# Patient Record
Sex: Male | Born: 1950 | Race: Black or African American | Hispanic: No | Marital: Single | Smoking: Never smoker
Health system: Southern US, Community
[De-identification: ages and names within clinical notes are randomized; demographics above are authoritative.]

## PROBLEM LIST (undated history)

## (undated) DIAGNOSIS — K4090 Unilateral inguinal hernia, without obstruction or gangrene, not specified as recurrent: Secondary | ICD-10-CM

## (undated) DIAGNOSIS — T421X1A Poisoning by iminostilbenes, accidental (unintentional), initial encounter: Secondary | ICD-10-CM

## (undated) DIAGNOSIS — R569 Unspecified convulsions: Secondary | ICD-10-CM

## (undated) DIAGNOSIS — I10 Essential (primary) hypertension: Secondary | ICD-10-CM

## (undated) DIAGNOSIS — J181 Lobar pneumonia, unspecified organism: Secondary | ICD-10-CM

## (undated) DIAGNOSIS — E871 Hypo-osmolality and hyponatremia: Secondary | ICD-10-CM

## (undated) DIAGNOSIS — Z9189 Other specified personal risk factors, not elsewhere classified: Secondary | ICD-10-CM

## (undated) DIAGNOSIS — K219 Gastro-esophageal reflux disease without esophagitis: Secondary | ICD-10-CM

## (undated) DIAGNOSIS — F79 Unspecified intellectual disabilities: Secondary | ICD-10-CM

## (undated) HISTORY — PX: APPENDECTOMY: SHX54

## (undated) HISTORY — PX: TONSILLECTOMY: SUR1361

## (undated) HISTORY — PX: MULTIPLE EXTRACTIONS WITH ALVEOLOPLASTY: SHX5342

---

## 1999-09-02 ENCOUNTER — Encounter: Admission: RE | Admit: 1999-09-02 | Discharge: 1999-09-02 | Payer: Self-pay | Admitting: Internal Medicine

## 2003-02-20 ENCOUNTER — Encounter: Admission: RE | Admit: 2003-02-20 | Discharge: 2003-02-20 | Payer: Self-pay | Admitting: Dentistry

## 2003-03-28 ENCOUNTER — Encounter: Payer: Self-pay | Admitting: Internal Medicine

## 2003-03-28 ENCOUNTER — Encounter: Admission: RE | Admit: 2003-03-28 | Discharge: 2003-03-28 | Payer: Self-pay | Admitting: Internal Medicine

## 2003-03-28 ENCOUNTER — Ambulatory Visit (HOSPITAL_COMMUNITY): Admission: RE | Admit: 2003-03-28 | Discharge: 2003-03-28 | Payer: Self-pay | Admitting: Internal Medicine

## 2003-04-04 ENCOUNTER — Ambulatory Visit (HOSPITAL_COMMUNITY): Admission: RE | Admit: 2003-04-04 | Discharge: 2003-04-04 | Payer: Self-pay | Admitting: Dentistry

## 2004-07-30 ENCOUNTER — Ambulatory Visit: Payer: Self-pay | Admitting: Dentistry

## 2007-12-12 ENCOUNTER — Emergency Department (HOSPITAL_COMMUNITY): Admission: EM | Admit: 2007-12-12 | Discharge: 2007-12-12 | Payer: Self-pay | Admitting: Emergency Medicine

## 2008-05-23 ENCOUNTER — Emergency Department (HOSPITAL_COMMUNITY): Admission: EM | Admit: 2008-05-23 | Discharge: 2008-05-23 | Payer: Self-pay | Admitting: Emergency Medicine

## 2008-07-19 ENCOUNTER — Emergency Department (HOSPITAL_COMMUNITY): Admission: EM | Admit: 2008-07-19 | Discharge: 2008-07-19 | Payer: Self-pay | Admitting: Emergency Medicine

## 2008-07-30 ENCOUNTER — Emergency Department (HOSPITAL_COMMUNITY): Admission: EM | Admit: 2008-07-30 | Discharge: 2008-07-30 | Payer: Self-pay | Admitting: Emergency Medicine

## 2010-12-24 ENCOUNTER — Emergency Department (HOSPITAL_COMMUNITY)
Admission: EM | Admit: 2010-12-24 | Discharge: 2010-12-24 | Disposition: A | Discharge status: Home / Self Care | Payer: PRIVATE HEALTH INSURANCE | Attending: Emergency Medicine | Admitting: Emergency Medicine

## 2010-12-24 DIAGNOSIS — Z79899 Other long term (current) drug therapy: Secondary | ICD-10-CM | POA: Insufficient documentation

## 2010-12-24 DIAGNOSIS — F79 Unspecified intellectual disabilities: Secondary | ICD-10-CM | POA: Insufficient documentation

## 2010-12-24 DIAGNOSIS — G40909 Epilepsy, unspecified, not intractable, without status epilepticus: Secondary | ICD-10-CM | POA: Insufficient documentation

## 2010-12-24 LAB — POCT I-STAT, CHEM 8
BUN: 10 mg/dL (ref 6–23)
Calcium, Ion: 1 mmol/L — ABNORMAL LOW (ref 1.12–1.32)
Chloride: 100 mEq/L (ref 96–112)
Glucose, Bld: 124 mg/dL — ABNORMAL HIGH (ref 70–99)
Potassium: 4.1 mEq/L (ref 3.5–5.1)

## 2010-12-26 ENCOUNTER — Emergency Department (HOSPITAL_COMMUNITY)
Admission: EM | Admit: 2010-12-26 | Discharge: 2010-12-27 | Disposition: A | Discharge status: Home / Self Care | Payer: PRIVATE HEALTH INSURANCE | Attending: Emergency Medicine | Admitting: Emergency Medicine

## 2010-12-26 DIAGNOSIS — J020 Streptococcal pharyngitis: Secondary | ICD-10-CM | POA: Insufficient documentation

## 2010-12-26 DIAGNOSIS — F79 Unspecified intellectual disabilities: Secondary | ICD-10-CM | POA: Insufficient documentation

## 2010-12-26 DIAGNOSIS — G40909 Epilepsy, unspecified, not intractable, without status epilepticus: Secondary | ICD-10-CM | POA: Insufficient documentation

## 2010-12-26 DIAGNOSIS — H9210 Otorrhea, unspecified ear: Secondary | ICD-10-CM | POA: Insufficient documentation

## 2010-12-26 DIAGNOSIS — R509 Fever, unspecified: Secondary | ICD-10-CM | POA: Insufficient documentation

## 2010-12-26 DIAGNOSIS — H60399 Other infective otitis externa, unspecified ear: Secondary | ICD-10-CM | POA: Insufficient documentation

## 2011-01-16 NOTE — Op Note (Signed)
Craig, Yates                          ACCOUNT NO.:  [id number]   MEDICAL RECORD NO.:  [id number]                   PATIENT TYPE:  AMB   LOCATION:  DAY                                  FACILITY:  [facility]   PHYSICIAN:  Charlynne Pander, D.D.S.          DATE OF BIRTH:  1950/12/26   DATE OF PROCEDURE:  04/04/2003  DATE OF DISCHARGE:                                 OPERATIVE REPORT   PREOPERATIVE DIAGNOSES:  1. Chronic seizure disorder.  2. Mental retardation.  3. History of acute pulpitis.  4. Chronic apical periodontitis.  5. Chronic periodontitis.  6. Multiple retained root segments.  7. Need for upper right quadrant tooth prosthetic surgery.   POSTOPERATIVE DIAGNOSES:  1. Chronic seizure disorder.  2. Mental retardation.  3. History of acute pulpitis.  4. Chronic apical periodontitis.  5. Chronic periodontitis.  6. Multiple retained root segments.  7. Need for upper right quadrant tooth prosthetic surgery.   OPERATIONS:  1. Dental examination.  2. Extraction of remaining teeth (tooth #s     2,3,5,6,7,17,20,21,22,23,24,25,26,27,28 and 29).  3. Three quadrants of alveoloplasty.  4. Upper right quadrant osseous tuberosity reduction.   SURGEON:  Charlynne Pander, D.D.S.   ASSISTANT:  Elliot Dally (Sales executive).   ANESTHESIA:  1. General anesthesia with a nasoendotracheal tube.  2. Local anesthesia with a total utilization of 5 carpules each, containing     36 mg of Xylocaine with  0.018 mg of epinephrine; as well as 2 carpules each containing 9 mg Marcaine  with 0.009 mg of epinephrine.   MEDICATIONS:  Ampicillin 2.0 g IV prior to invasive dental procedures.   SPECIMENS:  There were 16 teeth which were discarded.   DRAINS:  None.   CULTURES:  None.   COMPLICATIONS:  None.   FLUIDS:  1000 mL lactated Ringer's solution.   ESTIMATED BLOOD LOSS:  100 mL.   INDICATIONS:  The patient had a history of chronic seizure disorder and  mental retardation,  which prevented dental care from occurring in the  outpatient setting.  The patient was examined and treatment planned for  extraction of his remaining teeth, along with alveoloplasty and  preprosthetic surgery as indicated.  This treatment plan was formulated to  decrease the risk of complications associated with dental infection from  affecting the patient's systemic health and seizure disorder.   OPERATIVE FINDINGS:  The patient was examined in the operating room #6 at  [facility].  The teeth were identified for extraction.  The  patient was noted to be affected by a history of acute pulpitis symptoms,  chronic apical periodontitis, chronic periodontitis, multiple retained root  segments, and a need for preprosthetic surgery of the upper right quadrant.   DESCRIPTION OF PROCEDURE:  The patient was brought to the main operating  room #6.  The patient was placed in the supine position on the operating  room table.  General  anesthesia was induced with the nasoendotracheal tube,  per the anesthesia team.  The patient was then prepped and draped in the  usual manner for dental medicine procedure.  The oral cavity was thoroughly  examined, with the findings as noted above.  The patient was then ready for  the oral surgical procedure as follows:   Local anesthesia was administered sequentially throughout the 1-1/2 hour  long procedure.   The maxillary right quadrant was first approached.  Anesthesia was delivered  appropriately.  A #15 blade incision was made from the distal of the  tuberosity through the meso of #8.  A surgical flap was then deflected.  The  remaining teeth were then elevated with a series of straight elevators.  Tooth #2 was then removed with the 53R forceps without complications.  The  root segments for #'s 3, 5, 6 and 7 were then elevated out, and removed with  a 150 forceps without complications.  Alveoloplasty was then performed,  utilizing rongeurs and  bone file.  An osseous maxillary tuberosity reduction  was then achieved again with a rongeurs and bone file.  The tissues were  trimmed appropriately utilizing a #15 blade and soft tissue pickups.  The  surgical site was then irrigated with copious amounts of sterile saline.  The tissues were then trimmed appropriately with a soft tissue scissors.  The surgical site was then again irrigated with copious amounts of sterile  saline.  The surgical site was then closed from the distal __________  tuberosity through the meso of #8, utilizing 3-0 chromic gut suture in a  continuous interrupted suture technique x1.   The mandibular quadrants were then approached.  Anesthesia was delivered to  the mandibular left and mandibular right quadrants.  A #15 blade incision  was made from the distal of #30 through the meso of #22.  A surgical flap  was then reflected.  Tooth #s 23, 24, 25, 26, 27, 28 and 29 were then  elevated with a series of straight elevators.  These teeth were then removed  with a 151 forceps without complications.  Alveoloplasty was then performed,  utilizing rongeurs and bone file.  The tissues were then trimmed  appropriately.  The entire quadrant was then irrigated with copious amounts  of sterile saline.  The surgical site was then closed from the distal of #30  through the meso of #25, utilizing 3-0 chromic gut suture material in a  continuous interrupted suture technique x1.  An additional interrupted  suture was placed in the area of tooth #s 24 and 25.   The mandibular left quadrant was then approached.  A #15 blade incision was  made from the distal of #17 through the meso of #22.  A surgical flap was  then reflected.  The remaining lower left teeth were then subluxated with a  series of straight elevators.  Tooth #s 22, 21 and 20 were then removed with a 151 forceps without complications.  Tooth #17 was then approached.  The  tooth was sectioned utilizing a surgical  handpiece and bur, and copious  amounts of sterile saline.  The remaining root segments were then elevated  and removed with a 151 forceps without complications.  Alveoloplasty was  then performed, utilizing rongeurs and bone file.  The surgical site was  then irrigated with copious amounts of sterile saline.  The tissues were  then approximated and trimmed appropriately.  The surgical site was then  closed with a 3-0 chromic gut  suture material, and a continuous interrupted  suture technique from the distal of #17 through the meso of #24.   The entire mouth was then irrigated with copious amounts of sterile saline.  The patient was examined for complications, seeing none, the dental medicine  procedure was deemed to be complete.  The throat pack, which was placed at  the initiation of dental medicine procedure, was then removed at this time.  A series of 4 x 4 gauzes were placed in the mouth to aid hemostasis.  An  oral airway was also placed at this time at the request of the anesthesia  team.  The patient was then handed over to the anesthesia team for final  disposition.  After an appropriate amount of time, the patient was extubated  appropriately.  The patient was then transported to the post-anesthesia care  unit with stable vital signs and a good oxygenation level.  All counts were  correct for the dental medicine procedure.                                               Charlynne Pander, D.D.S.    RFK/MEDQ  D:  04/04/2003  T:  04/04/2003  Job:  [id number]   cc:   Marlan Palau, M.D.  [address]  [address]  [place]  [place] [address]  Fax: [phone]   Gertha Calkin, M.D.  Int. Med - Resident - [address]  [place], [place] [address]  Fax: [phone]   [phone], Dr. Chrisandra Carota  COPY DENTAL MEDICINE,

## 2011-06-01 LAB — COMPREHENSIVE METABOLIC PANEL
AST: 28
Albumin: 4.2
Alkaline Phosphatase: 132 — ABNORMAL HIGH
BUN: 11
CO2: 26
Chloride: 102
Creatinine, Ser: 0.9
GFR calc Af Amer: >60
GFR calc non Af Amer: >60
Potassium: 4.6
Total Bilirubin: 0.9

## 2011-06-01 LAB — CBC
MCHC: 33.5
Platelets: 208
RBC: 4.73

## 2011-06-01 LAB — DIFFERENTIAL
Basophils Absolute: 0
Basophils Relative: 0
Eosinophils Relative: 0
Lymphocytes Relative: 24
Monocytes Absolute: 0.6

## 2011-06-01 LAB — CARBAMAZEPINE LEVEL, TOTAL: Carbamazepine Lvl: 7.1

## 2011-06-02 LAB — POCT I-STAT, CHEM 8
BUN: 10
Calcium, Ion: 1.14
Chloride: 100
Creatinine, Ser: 1.1
Glucose, Bld: 115 — ABNORMAL HIGH
HCT: 43
Hemoglobin: 14.6
Potassium: 4.2
Sodium: 139
TCO2: 30

## 2011-06-02 LAB — GLUCOSE, CAPILLARY: Glucose-Capillary: 130 — ABNORMAL HIGH

## 2011-06-02 LAB — CARBAMAZEPINE LEVEL, TOTAL: Carbamazepine Lvl: 13.3 — ABNORMAL HIGH

## 2012-05-28 ENCOUNTER — Emergency Department (HOSPITAL_COMMUNITY)
Admission: EM | Admit: 2012-05-28 | Discharge: 2012-05-28 | Disposition: A | Discharge status: Home / Self Care | Payer: No Typology Code available for payment source | Attending: Emergency Medicine | Admitting: Emergency Medicine

## 2012-05-28 ENCOUNTER — Encounter (HOSPITAL_COMMUNITY): Payer: Self-pay | Admitting: Emergency Medicine

## 2012-05-28 DIAGNOSIS — S0093XA Contusion of unspecified part of head, initial encounter: Secondary | ICD-10-CM

## 2012-05-28 DIAGNOSIS — Y92410 Unspecified street and highway as the place of occurrence of the external cause: Secondary | ICD-10-CM | POA: Insufficient documentation

## 2012-05-28 DIAGNOSIS — I10 Essential (primary) hypertension: Secondary | ICD-10-CM | POA: Insufficient documentation

## 2012-05-28 DIAGNOSIS — S0083XA Contusion of other part of head, initial encounter: Secondary | ICD-10-CM | POA: Insufficient documentation

## 2012-05-28 DIAGNOSIS — S0003XA Contusion of scalp, initial encounter: Secondary | ICD-10-CM | POA: Insufficient documentation

## 2012-05-28 HISTORY — DX: Unspecified intellectual disabilities: F79

## 2012-05-28 HISTORY — DX: Unspecified convulsions: R56.9

## 2012-05-28 HISTORY — DX: Essential (primary) hypertension: I10
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
## 2013-03-31 HISTORY — PX: MULTIPLE EXTRACTIONS WITH ALVEOLOPLASTY: SHX5342

## 2013-06-18 ENCOUNTER — Encounter (HOSPITAL_COMMUNITY): Payer: Self-pay | Admitting: Emergency Medicine

## 2013-06-18 ENCOUNTER — Emergency Department (HOSPITAL_COMMUNITY)
Admission: EM | Admit: 2013-06-18 | Discharge: 2013-06-18 | Disposition: A | Discharge status: Home / Self Care | Payer: Medicaid Other | Attending: Emergency Medicine | Admitting: Emergency Medicine

## 2013-06-18 DIAGNOSIS — R569 Unspecified convulsions: Secondary | ICD-10-CM | POA: Insufficient documentation

## 2013-06-18 DIAGNOSIS — F79 Unspecified intellectual disabilities: Secondary | ICD-10-CM | POA: Insufficient documentation

## 2013-06-18 DIAGNOSIS — Z79899 Other long term (current) drug therapy: Secondary | ICD-10-CM | POA: Insufficient documentation

## 2013-06-18 DIAGNOSIS — Z0000 Encounter for general adult medical examination without abnormal findings: Secondary | ICD-10-CM

## 2013-06-18 DIAGNOSIS — Z0389 Encounter for observation for other suspected diseases and conditions ruled out: Secondary | ICD-10-CM | POA: Insufficient documentation

## 2013-06-18 DIAGNOSIS — I10 Essential (primary) hypertension: Secondary | ICD-10-CM | POA: Insufficient documentation

## 2013-06-18 NOTE — ED Provider Notes (Signed)
CSN: [id number]     Arrival date & time 06/18/13  1191 History  This chart was scribed for non-physician practitioner working with Harrold Donath R. Rubin Payor, MD by Greggory Stallion, ED scribe. This patient was seen in room TR07C/TR07C and the patient's care was started at 10:06 AM.   Chief Complaint  Patient presents with  . Follow-up   The history is provided by the patient and a relative. No language interpreter was used.   HPI Comments: Craig Yates is a 62 y.o. male with h/o MR who presents to the Emergency Department complaining of resolving sore throat. He has been handling secretions normally. Pt denies smoking cigarettes and fever. Sister wanted him to be evaluated since she has had respiratory symptoms.   Past Medical History  Diagnosis Date  . Hypertension   . Mental retardation   . Seizures    No past surgical history on file. No family history on file. History  Substance Use Topics  . Smoking status: Never Smoker   . Smokeless tobacco: Not on file  . Alcohol Use: No    Review of Systems  Unable to perform ROS   Allergies  Review of patient's allergies indicates no known allergies.  Home Medications   Current Outpatient Rx  Name  Route  Sig  Dispense  Refill  . acetaminophen (TYLENOL) 500 MG tablet   Oral   Take 1,000 mg by mouth every 6 (six) hours as needed for pain.         . carbamazepine (TEGRETOL) 200 MG tablet   Oral   Take 400 mg by mouth 3 (three) times daily.         Marland Kitchen gabapentin (NEURONTIN) 300 MG capsule   Oral   Take 300 mg by mouth 3 (three) times daily.         . simvastatin (ZOCOR) 20 MG tablet   Oral   Take 20 mg by mouth every evening.          BP 161/97  Pulse 60  Temp(Src) 97.9 F (36.6 C) (Oral)  Resp 20  SpO2 100%  Physical Exam  Nursing note and vitals reviewed. Constitutional: He is oriented to person, place, and time. He appears well-developed and well-nourished. No distress.  HENT:  Head: Normocephalic and  atraumatic.  Mouth/Throat: Oropharynx is clear and moist.  Eyes: EOM are normal.  Neck: Neck supple. No tracheal deviation present.  Cardiovascular: Normal rate, regular rhythm and normal heart sounds.   Pulmonary/Chest: Effort normal and breath sounds normal. No respiratory distress. He has no wheezes. He has no rales.  Musculoskeletal: Normal range of motion.  Neurological: He is alert and oriented to person, place, and time.  Skin: Skin is warm and dry.  Psychiatric: He has a normal mood and affect. His behavior is normal.    ED Course  Procedures (including critical care time)  DIAGNOSTIC STUDIES: Oxygen Saturation is 100% on RA, normal by my interpretation.    COORDINATION OF CARE: 10:09 AM-Discussed treatment plan which includes discharge with pt at bedside and pt agreed to plan.   Labs Review Labs Reviewed - No data to display Imaging Review No results found.  EKG Interpretation   None       MDM   1. Normal physical exam    Pt here with sister. He has history of mental retardation and sister provides information for HPI. Pt without signs of respiratory infection. He is well in appearance. No fever. Will discharge home without treatment.  I personally performed the services described in this documentation, which was scribed in my presence. The recorded information has been reviewed and is accurate.    Arthor Captain, PA-C 06/18/13 1431

## 2013-06-18 NOTE — ED Provider Notes (Signed)
Medical screening examination/treatment/procedure(s) were performed by non-physician practitioner and as supervising physician I was immediately available for consultation/collaboration.  Cammi Consalvo R. Arlie Riker, MD 06/18/13 1621 

## 2013-06-18 NOTE — ED Notes (Signed)
Pt discharged home with all belongings, alert and ambulatory upon discharge, no new RX pt and pt's sister verbalize understanding of discharge instructions

## 2013-06-18 NOTE — ED Notes (Signed)
Pt denies any pain or symptoms. Pt's sister brought him to ED because she felt like he was swallowing funny and he was also getting a sore throat. Sister is requesting we look at his throat and to check him out to make sure he doesn't have strep throat.

## 2013-09-11 ENCOUNTER — Ambulatory Visit: Payer: Self-pay | Admitting: Podiatry

## 2013-09-25 ENCOUNTER — Encounter: Payer: Self-pay | Admitting: Podiatry

## 2013-09-25 ENCOUNTER — Ambulatory Visit (INDEPENDENT_AMBULATORY_CARE_PROVIDER_SITE_OTHER): Payer: Medicare Other | Admitting: Podiatry

## 2013-09-25 ENCOUNTER — Ambulatory Visit: Payer: Self-pay | Admitting: Podiatry

## 2013-09-25 VITALS — BP 132/80 | HR 85 | Resp 12

## 2013-09-25 DIAGNOSIS — B351 Tinea unguium: Secondary | ICD-10-CM

## 2013-09-25 DIAGNOSIS — M79609 Pain in unspecified limb: Secondary | ICD-10-CM

## 2013-09-26 NOTE — Progress Notes (Signed)
Patient ID: Craig Yates, male   DOB: 04/22/1951, 63 y.o.   MRN: [id number]  Subjective: This patient presents for ongoing debridement of painful mycotic toenails.The last visit for this service was 05/25/2013 provided by DR. Regal.  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 Reappoint at three-month intervals

## 2013-11-22 ENCOUNTER — Emergency Department (HOSPITAL_COMMUNITY)
Admission: EM | Admit: 2013-11-22 | Discharge: 2013-11-23 | Disposition: A | Discharge status: Home / Self Care | Payer: Medicare Other | Attending: Emergency Medicine | Admitting: Emergency Medicine

## 2013-11-22 ENCOUNTER — Encounter (HOSPITAL_COMMUNITY): Payer: Self-pay | Admitting: Emergency Medicine

## 2013-11-22 DIAGNOSIS — J069 Acute upper respiratory infection, unspecified: Secondary | ICD-10-CM

## 2013-11-22 DIAGNOSIS — I10 Essential (primary) hypertension: Secondary | ICD-10-CM | POA: Insufficient documentation

## 2013-11-22 DIAGNOSIS — R079 Chest pain, unspecified: Secondary | ICD-10-CM | POA: Insufficient documentation

## 2013-11-22 DIAGNOSIS — F79 Unspecified intellectual disabilities: Secondary | ICD-10-CM | POA: Insufficient documentation

## 2013-11-22 DIAGNOSIS — Z79899 Other long term (current) drug therapy: Secondary | ICD-10-CM | POA: Insufficient documentation

## 2013-11-22 DIAGNOSIS — G40909 Epilepsy, unspecified, not intractable, without status epilepticus: Secondary | ICD-10-CM | POA: Insufficient documentation

## 2013-11-22 NOTE — ED Notes (Signed)
Sister reports non-productive cough x 3-4 days.  States she believes he may have a sore throat or ear pain.

## 2013-11-23 NOTE — ED Provider Notes (Signed)
CSN: [id number]     Arrival date & time 11/22/13  2317 History   First MD Initiated Contact with Patient 11/22/13 2343     Chief Complaint  Patient presents with  . Cough     (Consider location/radiation/quality/duration/timing/severity/associated sxs/prior Treatment) Patient is a 63 y.o. male presenting with cough. The history is provided by the patient and a parent. No language interpreter was used.  Cough Cough characteristics:  Dry Associated symptoms: chest pain, rhinorrhea and sore throat   Associated symptoms: no chills, no fever and no shortness of breath   Associated symptoms comment:  Per the patient's mother, he has had a cough for the past 3 days. No fever, nausea or vomiting. He has had some nasal congestion. He is handicapped, MR, and she is concerned that he isn't able to verbalize when he is ill so wanted to have him evaluated.    Past Medical History  Diagnosis Date  . Hypertension   . Mental retardation   . Seizures    History reviewed. No pertinent past surgical history. No family history on file. History  Substance Use Topics  . Smoking status: Never Smoker   . Smokeless tobacco: Not on file  . Alcohol Use: No    Review of Systems  Constitutional: Negative for fever and chills.  HENT: Positive for rhinorrhea and sore throat.   Respiratory: Positive for cough. Negative for shortness of breath.   Cardiovascular: Positive for chest pain.       Chest pain with cough.  Gastrointestinal: Negative.  Negative for nausea and vomiting.  Musculoskeletal: Negative.   Skin: Negative.   Neurological: Negative.       Allergies  Review of patient's allergies indicates no known allergies.  Home Medications   Current Outpatient Rx  Name  Route  Sig  Dispense  Refill  . carbamazepine (TEGRETOL) 200 MG tablet   Oral   Take 400 mg by mouth 3 (three) times daily.         Marland Kitchen. gabapentin (NEURONTIN) 300 MG capsule   Oral   Take 300 mg by mouth 3 (three) times  daily.         . simvastatin (ZOCOR) 20 MG tablet   Oral   Take 20 mg by mouth every evening.         . Vitamin D, Ergocalciferol, (DRISDOL) 50000 UNITS CAPS capsule   Oral   Take 50,000 Units by mouth every 7 (seven) days. Every Friday          BP 178/95  Pulse 70  Temp(Src) 98.7 F (37.1 C) (Oral)  Resp 18  Wt 173 lb 2 oz (78.529 kg)  SpO2 100% Physical Exam  Constitutional: He appears well-developed and well-nourished.  HENT:  Head: Normocephalic.  Right Ear: External ear normal.  Left Ear: External ear normal.  Nose: Nose normal.  Mouth/Throat: Oropharynx is clear and moist. No oropharyngeal exudate.  Eyes: Conjunctivae are normal.  Neck: Normal range of motion. Neck supple.  Cardiovascular: Normal rate and regular rhythm.   Pulmonary/Chest: Effort normal and breath sounds normal. He has no wheezes. He has no rales.  Abdominal: Soft. Bowel sounds are normal. There is no tenderness. There is no rebound and no guarding.  Musculoskeletal: Normal range of motion.  Neurological: He is alert.  Skin: Skin is warm and dry. No rash noted.  Psychiatric: He has a normal mood and affect.    ED Course  Procedures (including critical care time) Labs Review Labs Reviewed - No  data to display Imaging Review No results found.   EKG Interpretation None      MDM   Final diagnoses:  None    1. URI  Supportive care recommended for patient with viral appearing presentation.     Arnoldo Hooker, PA-C 11/23/13 0149

## 2013-11-23 NOTE — Discharge Instructions (Signed)
RECOMMEND MUCINEX, TYLENOL, PLENTY OF FLUIDS. FOLLOW UP WITH YOUR DOCTOR IF SYMPTOMS WORSEN. RETURN HERE AS NEEDED.  Upper Respiratory Infection, Adult An upper respiratory infection (URI) is also known as the common cold. It is often caused by a type of germ (virus). Colds are easily spread (contagious). You can pass it to others by kissing, coughing, sneezing, or drinking out of the same glass. Usually, you get better in 1 or 2 weeks.  HOME CARE   Only take medicine as told by your doctor.  Use a warm mist humidifier or breathe in steam from a hot shower.  Drink enough water and fluids to keep your pee (urine) clear or pale yellow.  Get plenty of rest.  Return to work when your temperature is back to normal or as told by your doctor. You may use a face mask and wash your hands to stop your cold from spreading. GET HELP RIGHT AWAY IF:   After the first few days, you feel you are getting worse.  You have questions about your medicine.  You have chills, shortness of breath, or brown or red spit (mucus).  You have yellow or brown snot (nasal discharge) or pain in the face, especially when you bend forward.  You have a fever, puffy (swollen) neck, pain when you swallow, or white spots in the back of your throat.  You have a bad headache, ear pain, sinus pain, or chest pain.  You have a high-pitched whistling sound when you breathe in and out (wheezing).  You have a lasting cough or cough up blood.  You have sore muscles or a stiff neck. MAKE SURE YOU:   Understand these instructions.  Will watch your condition.  Will get help right away if you are not doing well or get worse. Document Released: 02/03/2008 Document Revised: 11/09/2011 Document Reviewed: 12/22/2010 [facility] Patient Information 2014 [employer], [place].

## 2013-11-26 NOTE — ED Provider Notes (Signed)
Medical screening examination/treatment/procedure(s) were performed by non-physician practitioner and as supervising physician I was immediately available for consultation/collaboration.   EKG Interpretation None        Brandt LoosenJulie Manly, MD 11/26/13 [phone]

## 2013-12-25 ENCOUNTER — Encounter: Payer: Self-pay | Admitting: Podiatry

## 2013-12-25 ENCOUNTER — Ambulatory Visit (INDEPENDENT_AMBULATORY_CARE_PROVIDER_SITE_OTHER): Payer: Medicare Other | Admitting: Podiatry

## 2013-12-25 VITALS — BP 164/92 | HR 67 | Resp 18

## 2013-12-25 DIAGNOSIS — M79609 Pain in unspecified limb: Secondary | ICD-10-CM

## 2013-12-25 DIAGNOSIS — B351 Tinea unguium: Secondary | ICD-10-CM

## 2013-12-26 NOTE — Progress Notes (Signed)
Patient ID: Craig Yates, male   DOB: 12/21/1950, 63 y.o.   MRN: [id number] Subjective: This patient presents for ongoing debridement of painful mycotic toenails.  Objective: Incurvated, elongated, discolored, hypertrophic toenails x10  Assessment: Symptomatic onychomycoses x10  Plan: Nails x10 are debrided without a bleeding. Reappoint at three-month intervals.

## 2014-03-26 ENCOUNTER — Ambulatory Visit: Payer: Medicare Other | Admitting: Podiatry

## 2014-04-16 ENCOUNTER — Ambulatory Visit (INDEPENDENT_AMBULATORY_CARE_PROVIDER_SITE_OTHER): Payer: Medicare Other | Admitting: Podiatry

## 2014-04-16 ENCOUNTER — Encounter: Payer: Self-pay | Admitting: Podiatry

## 2014-04-16 VITALS — BP 157/90 | HR 64 | Resp 18

## 2014-04-16 DIAGNOSIS — M79609 Pain in unspecified limb: Secondary | ICD-10-CM

## 2014-04-16 DIAGNOSIS — M79676 Pain in unspecified toe(s): Secondary | ICD-10-CM

## 2014-04-16 DIAGNOSIS — B351 Tinea unguium: Secondary | ICD-10-CM

## 2014-04-17 NOTE — Progress Notes (Signed)
Patient ID: Craig Yates, male   DOB: [birth year], 63 y.o.   MRN: [id number]  Subjective: This patient presents for ongoing debrided of painful toenails  Objective: Elongated, hypertrophic, discolored toenails 6-10  Assessment: Symptomatic onychomycoses x10  Plan: Nails x10 are debrided without any bleeding  Reappoint x3 months

## 2014-05-15 ENCOUNTER — Observation Stay (HOSPITAL_COMMUNITY)
Admission: EM | Admit: 2014-05-15 | Discharge: 2014-05-16 | Disposition: A | Discharge status: Home / Self Care | Payer: Medicare Other | Attending: Internal Medicine | Admitting: Internal Medicine

## 2014-05-15 ENCOUNTER — Observation Stay (HOSPITAL_COMMUNITY): Payer: Medicare Other

## 2014-05-15 ENCOUNTER — Encounter (HOSPITAL_COMMUNITY): Payer: Self-pay | Admitting: Emergency Medicine

## 2014-05-15 DIAGNOSIS — Y939 Activity, unspecified: Secondary | ICD-10-CM | POA: Diagnosis not present

## 2014-05-15 DIAGNOSIS — Z79899 Other long term (current) drug therapy: Secondary | ICD-10-CM | POA: Insufficient documentation

## 2014-05-15 DIAGNOSIS — F79 Unspecified intellectual disabilities: Secondary | ICD-10-CM | POA: Diagnosis not present

## 2014-05-15 DIAGNOSIS — T421X1A Poisoning by iminostilbenes, accidental (unintentional), initial encounter: Secondary | ICD-10-CM | POA: Diagnosis present

## 2014-05-15 DIAGNOSIS — Z5189 Encounter for other specified aftercare: Secondary | ICD-10-CM | POA: Diagnosis not present

## 2014-05-15 DIAGNOSIS — Y929 Unspecified place or not applicable: Secondary | ICD-10-CM | POA: Diagnosis not present

## 2014-05-15 DIAGNOSIS — T426X1A Poisoning by other antiepileptic and sedative-hypnotic drugs, accidental (unintentional), initial encounter: Secondary | ICD-10-CM | POA: Insufficient documentation

## 2014-05-15 DIAGNOSIS — I10 Essential (primary) hypertension: Secondary | ICD-10-CM | POA: Diagnosis not present

## 2014-05-15 DIAGNOSIS — R5381 Other malaise: Secondary | ICD-10-CM | POA: Insufficient documentation

## 2014-05-15 DIAGNOSIS — R5383 Other fatigue: Secondary | ICD-10-CM | POA: Diagnosis present

## 2014-05-15 DIAGNOSIS — G40909 Epilepsy, unspecified, not intractable, without status epilepticus: Secondary | ICD-10-CM | POA: Diagnosis not present

## 2014-05-15 DIAGNOSIS — R569 Unspecified convulsions: Secondary | ICD-10-CM | POA: Diagnosis not present

## 2014-05-15 DIAGNOSIS — T421X1D Poisoning by iminostilbenes, accidental (unintentional), subsequent encounter: Secondary | ICD-10-CM

## 2014-05-15 LAB — COMPREHENSIVE METABOLIC PANEL
ALBUMIN: 4.1 g/dL (ref 3.5–5.2)
ALK PHOS: 158 U/L — AB (ref 39–117)
ALT: 18 U/L (ref 0–53)
AST: 20 U/L (ref 0–37)
Anion gap: 11 (ref 5–15)
BILIRUBIN TOTAL: 0.2 mg/dL — AB (ref 0.3–1.2)
BUN: 9 mg/dL (ref 6–23)
CHLORIDE: 91 meq/L — AB (ref 96–112)
CO2: 27 mEq/L (ref 19–32)
Calcium: 8.5 mg/dL (ref 8.4–10.5)
Creatinine, Ser: 0.7 mg/dL (ref 0.50–1.35)
GFR calc Af Amer: >90 mL/min (ref >90)
GFR calc non Af Amer: >90 mL/min (ref >90)
Glucose, Bld: 96 mg/dL (ref 70–99)
POTASSIUM: 4.4 meq/L (ref 3.7–5.3)
Sodium: 129 mEq/L — ABNORMAL LOW (ref 137–147)
TOTAL PROTEIN: 7.9 g/dL (ref 6.0–8.3)

## 2014-05-15 LAB — URINALYSIS, ROUTINE W REFLEX MICROSCOPIC
Bilirubin Urine: NEGATIVE
GLUCOSE, UA: NEGATIVE mg/dL
Hgb urine dipstick: NEGATIVE
KETONES UR: NEGATIVE mg/dL
LEUKOCYTES UA: NEGATIVE
Nitrite: NEGATIVE
PH: 7 (ref 5.0–8.0)
Protein, ur: NEGATIVE mg/dL
SPECIFIC GRAVITY, URINE: 1.009 (ref 1.005–1.030)
Urobilinogen, UA: 0.2 mg/dL (ref 0.0–1.0)

## 2014-05-15 LAB — DIFFERENTIAL
BASOS ABS: 0 10*3/uL (ref 0.0–0.1)
BASOS PCT: 1 % (ref 0–1)
Eosinophils Absolute: 0.1 10*3/uL (ref 0.0–0.7)
Eosinophils Relative: 2 % (ref 0–5)
LYMPHS PCT: 41 % (ref 12–46)
Lymphs Abs: 1.8 10*3/uL (ref 0.7–4.0)
MONO ABS: 0.4 10*3/uL (ref 0.1–1.0)
Monocytes Relative: 10 % (ref 3–12)
NEUTROS ABS: 2.1 10*3/uL (ref 1.7–7.7)
NEUTROS PCT: 46 % (ref 43–77)

## 2014-05-15 LAB — CBC
HCT: 35.9 % — ABNORMAL LOW (ref 39.0–52.0)
Hemoglobin: 12.8 g/dL — ABNORMAL LOW (ref 13.0–17.0)
MCH: 29.4 pg (ref 26.0–34.0)
MCHC: 35.7 g/dL (ref 30.0–36.0)
MCV: 82.5 fL (ref 78.0–100.0)
PLATELETS: 210 10*3/uL (ref 150–400)
RBC: 4.35 MIL/uL (ref 4.22–5.81)
RDW: 13 % (ref 11.5–15.5)
WBC: 4.4 10*3/uL (ref 4.0–10.5)

## 2014-05-15 LAB — CBG MONITORING, ED: GLUCOSE-CAPILLARY: 102 mg/dL — AB (ref 70–99)

## 2014-05-15 LAB — CARBAMAZEPINE LEVEL, TOTAL: Carbamazepine Lvl: 16.9 ug/mL (ref 4.0–12.0)

## 2014-05-15 MED ORDER — HYDRALAZINE HCL 20 MG/ML IJ SOLN
10.0000 mg | Freq: Once | INTRAMUSCULAR | Status: AC
  Administered 2014-05-15: 10 mg via INTRAVENOUS
  Filled 2014-05-15: qty 1

## 2014-05-15 MED ORDER — GABAPENTIN 300 MG PO CAPS
300.0000 mg | ORAL_CAPSULE | Freq: Three times a day (TID) | ORAL | Status: DC
  Administered 2014-05-15 – 2014-05-16 (×2): 300 mg via ORAL
  Filled 2014-05-15 (×4): qty 1

## 2014-05-15 MED ORDER — SODIUM CHLORIDE 0.9 % IJ SOLN
3.0000 mL | Freq: Two times a day (BID) | INTRAMUSCULAR | Status: DC
  Administered 2014-05-15 – 2014-05-16 (×2): 3 mL via INTRAVENOUS

## 2014-05-15 MED ORDER — ONDANSETRON HCL 4 MG/2ML IJ SOLN: 4.0000 mg | Freq: Four times a day (QID) | INTRAMUSCULAR | Status: DC | PRN

## 2014-05-15 MED ORDER — ONDANSETRON HCL 4 MG PO TABS: 4.0000 mg | ORAL_TABLET | Freq: Four times a day (QID) | ORAL | Status: DC | PRN

## 2014-05-15 MED ORDER — SODIUM CHLORIDE 0.9 % IV SOLN
INTRAVENOUS | Status: AC
  Administered 2014-05-15 – 2014-05-16 (×2): via INTRAVENOUS

## 2014-05-15 MED ORDER — SODIUM CHLORIDE 0.9 % IV BOLUS (SEPSIS)
500.0000 mL | Freq: Once | INTRAVENOUS | Status: AC
  Administered 2014-05-15: 500 mL via INTRAVENOUS

## 2014-05-15 NOTE — ED Notes (Signed)
Family sts that he was leaning to the right when he was trying to stand up. Family concerned because today is the first day of taking tegretol.

## 2014-05-15 NOTE — ED Notes (Signed)
Pt does have hx of htn listed in medical hx.

## 2014-05-15 NOTE — ED Notes (Addendum)
Per ems-- family reported pt started to have problems with gait at 1pm today. Pt presents with generalized weakness. Stroke scale is negative. Pt is hypertensive 220/110 with no hx of same. Pt c/o dizziness upon standing. cbg 139. Hr 80 100% RA. Pt just started taking Tegretol today.

## 2014-05-15 NOTE — H&P (Signed)
PCP:   Dorrene German, MD   Chief Complaint:  dizziness  HPI: 63 yo male h/o  Mental retardation, seizure disorder, htn comes in with one day of nausea and dizziness.  pts sister dispenses his medications for him, and just realized that she had been doubling his tegretol pills.  Yesterday she went to the pharmacy and they gave her his tegretol but it was in a different kind of bottle.  So she was giving him his meds wrong.  He has only gotten  extra of tegretol in the last 12 hours.  He is feeling better since getting ivf in the ED.  No dizziness.  He lives with his sister.  No recent illnessess.    Review of Systems:  Positive and negative as per HPI otherwise all other systems are negative  Past Medical History: Past Medical History  Diagnosis Date  . Hypertension   . Mental retardation   . Seizures    History reviewed. No pertinent past surgical history.  Medications: Prior to Admission medications   Medication Sig Start Date End Date Taking? Authorizing Provider  carbamazepine (TEGRETOL) 200 MG tablet Take 400 mg by mouth 3 (three) times daily.   Yes Historical Provider, MD  gabapentin (NEURONTIN) 300 MG capsule Take 300 mg by mouth 3 (three) times daily.   Yes Historical Provider, MD  simvastatin (ZOCOR) 20 MG tablet Take 20 mg by mouth every evening.   Yes Historical Provider, MD  Vitamin D, Ergocalciferol, (DRISDOL) 50000 UNITS CAPS capsule Take 50,000 Units by mouth every 7 (seven) days. Every Friday   Yes Historical Provider, MD    Allergies:  No Known Allergies  Social History:  reports that he has never smoked. He does not have any smokeless tobacco history on file. He reports that he does not drink alcohol or use illicit drugs.  Family History: None   Physical Exam: Filed Vitals:   05/15/14 2130 05/15/14 2200 05/15/14 2234 05/15/14 2250  BP: 142/78 154/79 154/79 177/87  Pulse: 85 80 85 87  Temp:    98.2 F (36.8 C)  TempSrc:    Oral  Resp: Weight:    81.421 kg (179 lb 8 oz)  SpO2: 100% 100% 95% 100%   General appearance: alert, cooperative, no distress and slowed mentation Head: Normocephalic, without obvious abnormality, atraumatic Eyes: negative Nose: Nares normal. Septum midline. Mucosa normal. No drainage or sinus tenderness. Neck: no JVD and supple, symmetrical, trachea midline Lungs: clear to auscultation bilaterally Heart: regular rate and rhythm, S1, S2 normal, no murmur, click, rub or gallop Abdomen: soft, non-tender; bowel sounds normal; no masses,  no organomegaly Extremities: extremities normal, atraumatic, no cyanosis or edema Pulses: 2+ and symmetric Skin: Skin color, texture, turgor normal. No rashes or lesions Neurologic: Grossly normal    Labs on Admission:   Recent Labs  05/15/14 1902  NA 129*  K 4.4  CL 91*  CO2 27  GLUCOSE 96  BUN 9  CREATININE 0.70  CALCIUM 8.5    Recent Labs  05/15/14 1902  AST 20  ALT 18  ALKPHOS 158*  BILITOT 0.2*  PROT 7.9  ALBUMIN 4.1    Recent Labs  05/15/14 1902  WBC 4.4  NEUTROABS 2.1  HGB 12.8*  HCT 35.9*  MCV 82.5  PLT 210    Radiological Exams on Admission: No results found.  Assessment/Plan  63 yo male with dizziness and accidental tegretol overdose  Principal Problem:   Accidental overdose by  carbamazepine-  ivf overnight.  obs on tele.  ekg no acute changes.  Repeat tegretol level in am, and hold his dosing for now.  Place on seizure precautions.  Continue educate sister on reading all bottles before dispensing his meds.  She understands this now.  Active Problems:  Stable unless o/w noted   Seizures   Mental retardation   Hypertension  obs on tele.  Full code.  DAVID,RACHAL A 05/15/2014, 11:33 PM

## 2014-05-15 NOTE — ED Notes (Signed)
Hospitalist consult at bedside. 

## 2014-05-15 NOTE — ED Notes (Addendum)
Pharmacy spoke to this RN regarding pt medication administration by family. Family doubled pt dose of tegretol today rather than giving tegretol and bp medication.

## 2014-05-15 NOTE — ED Provider Notes (Signed)
CSN: [id number]     Arrival date & time 05/15/14  1847 History   First MD Initiated Contact with Patient 05/15/14 1930     Chief Complaint  Patient presents with  . Weakness     (Consider location/radiation/quality/duration/timing/severity/associated sxs/prior Treatment) HPI Craig Yates 63 y.o. with a history of developmental delay, seizures (recently started tegretol reportedly) who presents with concern of generalized weakness and possible dizziness. The history his limited due to patient's DD. Patient presents with his sister. She provide most of the history. He reports that "I am weak, I am weak". The sister reports that he seemed confused earlier today and was off balance and appeared like he was going to fall over. This started during the mid afternoon. She thinks it may have resolved. She reports that she has two bottles of his tegretol, but did not know they were the same medications. She has been giving the same dose of both, which has essentially doubled the dose over the past 24 hours. There has been no N/V/D. He denies any chest pain or SOB. Urinating unchanged from baseline. Activity level has been at baseline as well.   Past Medical History  Diagnosis Date  . Hypertension   . Mental retardation   . Seizures    History reviewed. No pertinent past surgical history. No family history on file. History  Substance Use Topics  . Smoking status: Never Smoker   . Smokeless tobacco: Not on file  . Alcohol Use: No    Review of Systems  All other systems reviewed and are negative.     Allergies  Review of patient's allergies indicates no known allergies.  Home Medications   Prior to Admission medications   Medication Sig Start Date End Date Taking? Authorizing Provider  carbamazepine (TEGRETOL) 200 MG tablet Take 400 mg by mouth 3 (three) times daily.   Yes Historical Provider, MD  gabapentin (NEURONTIN) 300 MG capsule Take 300 mg by mouth 3 (three) times daily.   Yes  Historical Provider, MD  simvastatin (ZOCOR) 20 MG tablet Take 20 mg by mouth every evening.   Yes Historical Provider, MD  Vitamin D, Ergocalciferol, (DRISDOL) 50000 UNITS CAPS capsule Take 50,000 Units by mouth every 7 (seven) days. Every Friday   Yes Historical Provider, MD   BP 177/87  Pulse 87  Temp(Src) 98.2 F (36.8 C) (Oral)  Resp 16  Wt 179 lb 8 oz (81.421 kg)  SpO2 100% Physical Exam  Nursing note and vitals reviewed. Constitutional: He appears well-developed and well-nourished. No distress.  HENT:  Head: Normocephalic and atraumatic.  Eyes: Conjunctivae and EOM are normal. Right eye exhibits no discharge. Left eye exhibits no discharge.  Neck: Normal range of motion. Neck supple. No tracheal deviation present.  Cardiovascular: Normal rate, regular rhythm and normal heart sounds.  Exam reveals no friction rub.   No murmur heard. Pulmonary/Chest: Effort normal and breath sounds normal. No stridor. No respiratory distress. He has no wheezes. He has no rales. He exhibits no tenderness.  Abdominal: Soft. He exhibits no distension. There is no tenderness. There is no rebound and no guarding.  Neurological: He is alert.  Oriented to self and place Strength in flexion and extension at the shoulders, elbows, wrists, hips, knee, and ankle 5/5 bilaterally EHL 5/5 bilaterally Grip strength 5/5 bilaterally Finger to nose, heel toe shin and rapid alternating movements intact bilaterally. No pronator drift bilaterally.  Sensation over the dorsum of the hand over the 1st, second and 5th metacarpal,  and the lateral forearm and lateral shoulder intact bilaterally.  Sensation over the medial and lateral malleolus, and over the 1st metatarsal intact bilaterally.  Skin: Skin is warm.  Psychiatric: He has a normal mood and affect.    ED Course  Procedures (including critical care time) Labs Review Labs Reviewed  CBC - Abnormal; Notable for the following:    Hemoglobin 12.8 (*)    HCT  35.9 (*)    All other components within normal limits  COMPREHENSIVE METABOLIC PANEL - Abnormal; Notable for the following:    Sodium 129 (*)    Chloride 91 (*)    Alkaline Phosphatase 158 (*)    Total Bilirubin 0.2 (*)    All other components within normal limits  CARBAMAZEPINE LEVEL, TOTAL - Abnormal; Notable for the following:    Carbamazepine Lvl 16.9 (*)    All other components within normal limits  CBG MONITORING, ED - Abnormal; Notable for the following:    Glucose-Capillary 102 (*)    All other components within normal limits  URINALYSIS, ROUTINE W REFLEX MICROSCOPIC  DIFFERENTIAL  BASIC METABOLIC PANEL  CBC  CARBAMAZEPINE LEVEL, TOTAL    Imaging Review Ct Head Wo Contrast  05/15/2014   CLINICAL DATA:  Generalized weakness and hypertension.  Dizziness.  EXAM: CT HEAD WITHOUT CONTRAST  TECHNIQUE: Contiguous axial images were obtained from the base of the skull through the vertex without intravenous contrast.  COMPARISON:  None.  FINDINGS: There is no evidence of acute infarction, mass lesion, or intra- or extra-axial hemorrhage on CT.  Cerebellar atrophy is noted.  The brainstem and fourth ventricle are within normal limits. The third and lateral ventricles, and basal ganglia are unremarkable in appearance. The cerebral hemispheres are symmetric in appearance, with normal gray-white differentiation. No mass effect or midline shift is seen.  There is no evidence of fracture; visualized osseous structures are unremarkable in appearance. The visualized portions of the orbits are within normal limits. There is near complete opacification of the right maxillary sinus. The remaining paranasal sinuses and mastoid air cells are well-aerated. No significant soft tissue abnormalities are seen.  IMPRESSION: 1. No acute intracranial pathology seen on CT. 2. Cerebellar atrophy noted. 3. Near complete opacification of the right maxillary sinus.   Electronically Signed   By: Roanna Raider M.D.   On:  05/15/2014 23:05     EKG Interpretation None      MDM   Final diagnoses:  Seizures  Mental retardation  Essential hypertension  Accidental overdose by carbamazepine, subsequent encounter    Pt presents with AMS in the setting of taking too much tegretol accidentally. Sister reports his current MS is at his baseline. AFVSS. Neurologic exam unremarkable. Tegretol level elevated at 16. QRS and QT not prolonged. Will need admission for tele and repeat tegretol level to ensure it is downtrending. CT head NAICA. Slight hyponatremia. LFTs not significantly elevated. Patient was admitted to the hospitalist. Care discussed with my attending, Dr. Rosalia Hammers.     Sena Hitch, MD 05/15/14 [phone]

## 2014-05-16 ENCOUNTER — Encounter (HOSPITAL_COMMUNITY): Payer: Self-pay | Admitting: General Practice

## 2014-05-16 DIAGNOSIS — T426X1A Poisoning by other antiepileptic and sedative-hypnotic drugs, accidental (unintentional), initial encounter: Secondary | ICD-10-CM

## 2014-05-16 DIAGNOSIS — G40909 Epilepsy, unspecified, not intractable, without status epilepticus: Secondary | ICD-10-CM | POA: Diagnosis not present

## 2014-05-16 LAB — BASIC METABOLIC PANEL
Anion gap: 13 (ref 5–15)
BUN: 8 mg/dL (ref 6–23)
CALCIUM: 8.2 mg/dL — AB (ref 8.4–10.5)
CO2: 24 meq/L (ref 19–32)
Chloride: 96 mEq/L (ref 96–112)
Creatinine, Ser: 0.66 mg/dL (ref 0.50–1.35)
GFR calc Af Amer: >90 mL/min (ref >90)
GFR calc non Af Amer: >90 mL/min (ref >90)
GLUCOSE: 106 mg/dL — AB (ref 70–99)
Potassium: 4 mEq/L (ref 3.7–5.3)
SODIUM: 133 meq/L — AB (ref 137–147)

## 2014-05-16 LAB — CBC
HEMATOCRIT: 34.4 % — AB (ref 39.0–52.0)
Hemoglobin: 12.3 g/dL — ABNORMAL LOW (ref 13.0–17.0)
MCH: 29.3 pg (ref 26.0–34.0)
MCHC: 35.8 g/dL (ref 30.0–36.0)
MCV: 81.9 fL (ref 78.0–100.0)
Platelets: 208 10*3/uL (ref 150–400)
RBC: 4.2 MIL/uL — AB (ref 4.22–5.81)
RDW: 13.2 % (ref 11.5–15.5)
WBC: 6.2 10*3/uL (ref 4.0–10.5)

## 2014-05-16 LAB — CARBAMAZEPINE LEVEL, TOTAL: Carbamazepine Lvl: 12.2 ug/mL — ABNORMAL HIGH (ref 4.0–12.0)

## 2014-05-16 NOTE — Progress Notes (Signed)
Utilization Review Completed.Akila Batta T9/16/2015  

## 2014-05-16 NOTE — Discharge Summary (Signed)
Physician Discharge Summary  Craig Yates:[id number] DOB: 12-Feb-1951 DOA: 05/15/2014  PCP: Dorrene German, MD  Admit date: 05/15/2014 Discharge date: 05/16/2014  Time spent: 35 minutes  Recommendations for Outpatient Follow-up:  1. Follow up BMET to reassess hyponatremia, CBC to reassess hemoglobin, and measurement of BP. 2. Discharge home  Discharge Diagnoses:  Principal Problem:   Accidental overdose by carbamazepine Active Problems:   Seizures   Mental retardation   Hypertension   Discharge Condition: stable  Diet recommendation: Regular diet  Filed Weights   05/15/14 2250  Weight: 81.421 kg (179 lb 8 oz)    History of present illness:  63 yo male with PMH of mental retardation and seizure disorder who presented with dizziness and nausea for one day. Patients sister dispenses his medications, and realized that she had been doubling his tegretol pills.  He received about 400 mg extra of tegretol in the last 12 hours.  He denies any other complaints.  In ED, he was started on IV fluids, with much improvement in his dizziness.    Hospital Course:   Accidental overdose by carbamazepine -Stable on discharge- much improvement in dizziness and nausea. CT without acute abnormalities, EKG  unremarkable.  Monitored on telemetry  -Carbamazepine levels at 12.2, down trended from 16.9 -Sister is aware of importance of proper medication dispensing.  -Continue home regimen of Tegretol on discharge -Continue home regimen of gabapentin  Dyslipidemia -Continue home regimen of simvastatin   Vitamin D deficiency Continue home regimen of vit D  Hyponatremia -mild at 133. Likely due to hypervolemia from IV fluids -Follow up BMET to reassess   Anemia -mild with hgb 12.3.No past/current history of overt bleeding  -likely due to hypervolemia related to IV fluids administration -follow up CBC to reassess hemoglobin  Hypertension -stable on discharge- likely secondary to  carbamazepine overdose -No known history of high BP and not currently on any home regimen -Follow up with PCP to reassess BP  Mental retardation   Procedures:  None  Consultations:  None  Discharge Exam: Filed Vitals:   05/16/14 0946  BP: 141/85  Pulse: 69  Temp: 99.1 F (37.3 C)  Resp: 18     Exam General: alert, cooperative, no distress, slowed mentation Eyes: Anicteric account.  Cardiovascular: Regular rate and rhythm.  No murmurs, rubs, or gallops. Respiratory: Clear to auscultate bilaterally.  No rhonchi or crepitations. Abdomen: Soft nontender bowel sounds present. No guarding or rigidity.  Musculoskeletal: No edema.  Psychiatric: Appears normal.  Neurologic: grossly normal  Discharge Instructions    Medication List         carbamazepine 200 MG tablet  Commonly known as:  TEGRETOL  Take 400 mg by mouth 3 (three) times daily.     gabapentin 300 MG capsule  Commonly known as:  NEURONTIN  Take 300 mg by mouth 3 (three) times daily.     simvastatin 20 MG tablet  Commonly known as:  ZOCOR  Take 20 mg by mouth every evening.     Vitamin D (Ergocalciferol) 50000 UNITS Caps capsule  Commonly known as:  DRISDOL  Take 50,000 Units by mouth every 7 (seven) days. Every Friday       No Known Allergies Follow-up Information   Follow up with AVBUERE,EDWIN A, MD. Schedule an appointment as soon as possible for a visit in 1 week.   Specialty:  Internal Medicine   Contact information:   [address] [place] [address] [phone]  The results of significant diagnostics from this hospitalization (including imaging, microbiology, ancillary and laboratory) are listed below for reference.    Significant Diagnostic Studies: Ct Head Wo Contrast  05/15/2014   CLINICAL DATA:  Generalized weakness and hypertension.  Dizziness.  EXAM: CT HEAD WITHOUT CONTRAST  TECHNIQUE: Contiguous axial images were obtained from the base of the skull through the  vertex without intravenous contrast.  COMPARISON:  None.  FINDINGS: There is no evidence of acute infarction, mass lesion, or intra- or extra-axial hemorrhage on CT.  Cerebellar atrophy is noted.  The brainstem and fourth ventricle are within normal limits. The third and lateral ventricles, and basal ganglia are unremarkable in appearance. The cerebral hemispheres are symmetric in appearance, with normal gray-white differentiation. No mass effect or midline shift is seen.  There is no evidence of fracture; visualized osseous structures are unremarkable in appearance. The visualized portions of the orbits are within normal limits. There is near complete opacification of the right maxillary sinus. The remaining paranasal sinuses and mastoid air cells are well-aerated. No significant soft tissue abnormalities are seen.  IMPRESSION: 1. No acute intracranial pathology seen on CT. 2. Cerebellar atrophy noted. 3. Near complete opacification of the right maxillary sinus.   Electronically Signed   By: Roanna Raider M.D.   On: 05/15/2014 23:05    Microbiology: No results found for this or any previous visit (from the past 240 hour(s)).   Labs: Basic Metabolic Panel:  Recent Labs Lab 05/15/14 1902 05/16/14 0412  NA 129* 133*  K 4.4 4.0  CL 91* 96  CO2 27 24  GLUCOSE 96 106*  BUN 9 8  CREATININE 0.70 0.66  CALCIUM 8.5 8.2*   Liver Function Tests:  Recent Labs Lab 05/15/14 1902  AST 20  ALT 18  ALKPHOS 158*  BILITOT 0.2*  PROT 7.9  ALBUMIN 4.1   No results found for this basename: LIPASE, AMYLASE,  in the last 168 hours No results found for this basename: AMMONIA,  in the last 168 hours CBC:  Recent Labs Lab 05/15/14 1902 05/16/14 0412  WBC 4.4 6.2  NEUTROABS 2.1  --   HGB 12.8* 12.3*  HCT 35.9* 34.4*  MCV 82.5 81.9  PLT 210 208   Cardiac Enzymes: No results found for this basename: CKTOTAL, CKMB, CKMBINDEX, TROPONINI,  in the last 168 hours BNP: BNP (last 3 results) No  results found for this basename: PROBNP,  in the last 8760 hours CBG:  Recent Labs Lab 05/15/14 1910  GLUCAP 102*       Signed:  Illa Level PA-C  [facility] 05/16/2014, 11:45 AM

## 2014-05-16 NOTE — Discharge Summary (Signed)
-   I have discussed the plan and reviewed the data as above. - Did talk it over with the patient's sister and there was a confusion about the dose of the medication. - He will restart his Tegretol to morning.

## 2014-05-18 NOTE — ED Provider Notes (Signed)
63 y.o,. Male level caveat secondary to developmental delay.  Sister provides history.  Patient accidentally given extra tegretol.  Patient with difficulty walking.  wdwn nad Filed Vitals:   05/16/14 0946  BP: 141/85  Pulse: 69  Temp: 99.1 F (37.3 C)  Resp: 18   No focal neurologic deficit. I saw and evaluated the patient, reviewed the resident's note and I agree with the findings and plan.   EKG Interpretation   Date/Time:  Tuesday May 15 2014 18:53:46 EDT Ventricular Rate:  74 PR Interval:  189 QRS Duration: 97 QT Interval:  397 QTC Calculation: 440 R Axis:   52 Text Interpretation:  Sinus rhythm Abnormal R-wave progression, early  transition Borderline ST elevation, anterior leads Baseline wander in  lead(s) V4 ED PHYSICIAN INTERPRETATION AVAILABLE IN [facility]  Confirmed by TEST, Record ([id number]) on 05/17/2014 6:53:21 AM        Hilario Quarry, MD 05/18/14 [id number]

## 2014-07-16 ENCOUNTER — Telehealth: Payer: Self-pay | Admitting: Internal Medicine

## 2014-07-23 ENCOUNTER — Ambulatory Visit (INDEPENDENT_AMBULATORY_CARE_PROVIDER_SITE_OTHER): Payer: Medicare Other | Admitting: Podiatry

## 2014-07-23 ENCOUNTER — Encounter: Payer: Self-pay | Admitting: Podiatry

## 2014-07-23 DIAGNOSIS — M79676 Pain in unspecified toe(s): Secondary | ICD-10-CM

## 2014-07-23 DIAGNOSIS — B351 Tinea unguium: Secondary | ICD-10-CM

## 2014-07-24 NOTE — Progress Notes (Signed)
Patient ID: Craig Yates, male   DOB: 11/17/1950, 63 y.o.   MRN: [id number]  Subjective: Patient presents for ongoing debridement of painful toenails when walking wearing shoes  Objective: The toenails are elongated, discolored, hypertrophic, incurvated 6-10  Assessment: Symptomatic onychomycoses 6-10  Plan: Nails 10 are debrided without a bleeding  Reappoint 3 months

## 2014-10-22 ENCOUNTER — Encounter: Payer: Self-pay | Admitting: Podiatry

## 2014-10-22 ENCOUNTER — Ambulatory Visit (INDEPENDENT_AMBULATORY_CARE_PROVIDER_SITE_OTHER): Payer: Medicare Other | Admitting: Podiatry

## 2014-10-22 DIAGNOSIS — M79676 Pain in unspecified toe(s): Secondary | ICD-10-CM | POA: Diagnosis not present

## 2014-10-22 DIAGNOSIS — B351 Tinea unguium: Secondary | ICD-10-CM | POA: Diagnosis not present

## 2014-10-23 NOTE — Progress Notes (Signed)
Patient ID: Craig Yates, male   DOB: 10/14/1950, 64 y.o.   MRN: [id number]   Subjective This patient presents today complaining of painful toenails. His sister is present in the room with him today  Objective: The toenails are hypertrophic, elongated, discolored, incurvated and tender to palpation 6-10  Assessment: Symptomatic onychomycoses 6-10  Plan: Debridement toenails 10 without a bleeding  Reappoint 3 months            

## 2015-01-21 ENCOUNTER — Encounter: Payer: Self-pay | Admitting: Podiatry

## 2015-01-21 ENCOUNTER — Ambulatory Visit (INDEPENDENT_AMBULATORY_CARE_PROVIDER_SITE_OTHER): Payer: Medicare Other | Admitting: Podiatry

## 2015-01-21 DIAGNOSIS — M79676 Pain in unspecified toe(s): Secondary | ICD-10-CM | POA: Diagnosis not present

## 2015-01-21 DIAGNOSIS — B351 Tinea unguium: Secondary | ICD-10-CM | POA: Diagnosis not present

## 2015-01-22 NOTE — Progress Notes (Signed)
Patient ID: Craig Yates, male   DOB: [birth year], 64 y.o.   MRN: [id number]   Subjective This patient presents today complaining of painful toenails. His sister is present in the room with him today  Objective: The toenails are hypertrophic, elongated, discolored, incurvated and tender to palpation 6-10  Assessment: Symptomatic onychomycoses 6-10  Plan: Debridement toenails 10 without a bleeding  Reappoint 3 months

## 2015-04-24 ENCOUNTER — Ambulatory Visit (INDEPENDENT_AMBULATORY_CARE_PROVIDER_SITE_OTHER): Payer: Medicare Other | Admitting: Podiatry

## 2015-04-24 ENCOUNTER — Encounter: Payer: Self-pay | Admitting: Podiatry

## 2015-04-24 DIAGNOSIS — M79676 Pain in unspecified toe(s): Secondary | ICD-10-CM | POA: Diagnosis not present

## 2015-04-24 DIAGNOSIS — B351 Tinea unguium: Secondary | ICD-10-CM | POA: Diagnosis not present

## 2015-04-25 NOTE — Progress Notes (Signed)
Patient ID: Craig Yates, male   DOB: 1951-03-01, 64 y.o.   MRN: [id number]  Subjective: This patient presents today with sister present treatment room who is requesting debridement of her brothers painful toenails  Objective: Confused patient The toenails are elongated, hypertrophic, incurvated, discolored and tender to direct palpation 6-10  Assessment: Symptomatic onychomycoses 6-10  Plan: Debridement of toenails 10 mechanically and electrically without any bleeding  Reappoint 3 months

## 2015-07-19 ENCOUNTER — Emergency Department (HOSPITAL_COMMUNITY)
Admission: EM | Admit: 2015-07-19 | Discharge: 2015-07-19 | Disposition: A | Discharge status: Home / Self Care | Payer: Medicare Other | Attending: Emergency Medicine | Admitting: Emergency Medicine

## 2015-07-19 ENCOUNTER — Encounter (HOSPITAL_COMMUNITY): Payer: Self-pay | Admitting: *Deleted

## 2015-07-19 DIAGNOSIS — F79 Unspecified intellectual disabilities: Secondary | ICD-10-CM | POA: Insufficient documentation

## 2015-07-19 DIAGNOSIS — R059 Cough, unspecified: Secondary | ICD-10-CM

## 2015-07-19 DIAGNOSIS — Z79899 Other long term (current) drug therapy: Secondary | ICD-10-CM | POA: Insufficient documentation

## 2015-07-19 DIAGNOSIS — I10 Essential (primary) hypertension: Secondary | ICD-10-CM | POA: Insufficient documentation

## 2015-07-19 DIAGNOSIS — R05 Cough: Secondary | ICD-10-CM

## 2015-07-19 DIAGNOSIS — G40909 Epilepsy, unspecified, not intractable, without status epilepticus: Secondary | ICD-10-CM | POA: Diagnosis not present

## 2015-07-19 NOTE — Discharge Instructions (Signed)
Cool Mist Vaporizers °Vaporizers may help relieve the symptoms of a cough and cold. They add moisture to the air, which helps mucus to become thinner and less sticky. This makes it easier to breathe and cough up secretions. Cool mist vaporizers do not cause serious burns like hot mist vaporizers, which may also be called steamers or humidifiers. Vaporizers have not been proven to help with colds. You should not use a vaporizer if you are allergic to mold. °HOME CARE INSTRUCTIONS °· Follow the package instructions for the vaporizer. °· Do not use anything other than distilled water in the vaporizer. °· Do not run the vaporizer all of the time. This can cause mold or bacteria to grow in the vaporizer. °· Clean the vaporizer after each time it is used. °· Clean and dry the vaporizer well before storing it. °· Stop using the vaporizer if worsening respiratory symptoms develop. °  °This information is not intended to replace advice given to you by your health care provider. Make sure you discuss any questions you have with your health care provider. °  °Document Released: 05/14/2004 Document Revised: 08/22/2013 Document Reviewed: 01/04/2013 °Elsevier Interactive Patient Education ©2016 [employer] ° °Cough, Adult °A cough helps to clear your throat and lungs. A cough may last only 2-3 weeks (acute), or it may last longer than 8 weeks (chronic). Many different things can cause a cough. A cough may be a sign of an illness or another medical condition. °HOME CARE °· Pay attention to any changes in your cough. °· Take medicines only as told by your doctor. °¨ If you were prescribed an antibiotic medicine, take it as told by your doctor. Do not stop taking it even if you start to feel better. °¨ Talk with your doctor before you try using a cough medicine. °· Drink enough fluid to keep your pee (urine) clear or pale yellow. °· If the air is dry, use a cold steam vaporizer or humidifier in your home. °· Stay away from things  that make you cough at work or at home. °· If your cough is worse at night, try using extra pillows to raise your head up higher while you sleep. °· Do not smoke, and try not to be around smoke. If you need help quitting, ask your doctor. °· Do not have caffeine. °· Do not drink alcohol. °· Rest as needed. °GET HELP IF: °· You have new problems (symptoms). °· You cough up yellow fluid (pus). °· Your cough does not get better after 2-3 weeks, or your cough gets worse. °· Medicine does not help your cough and you are not sleeping well. °· You have pain that gets worse or pain that is not helped with medicine. °· You have a fever. °· You are losing weight and you do not know why. °· You have night sweats. °GET HELP RIGHT AWAY IF: °· You cough up blood. °· You have trouble breathing. °· Your heartbeat is very fast. °  °This information is not intended to replace advice given to you by your health care provider. Make sure you discuss any questions you have with your health care provider. °  °Document Released: 04/30/2011 Document Revised: 05/08/2015 Document Reviewed: 10/24/2014 °Elsevier Interactive Patient Education ©2016 [employer] ° °

## 2015-07-19 NOTE — ED Notes (Signed)
Pt reports a cough for 2-3 days.

## 2015-07-19 NOTE — ED Provider Notes (Signed)
CSN: [id number]     Arrival date & time 07/19/15  [id number] History  By signing my name below, I, Lyndel SafeKaitlyn Shelton, attest that this documentation has been prepared under the direction and in the presence of AvayaSamantha Pranathi Winfree, PA-C. Electronically Signed: Lyndel SafeKaitlyn Shelton, ED Scribe. 07/19/2015. 9:21 AM.   Chief Complaint  Patient presents with  . Cough   The history is provided by a relative. No language interpreter was used.   HPI Comments: Avon Gullyommy J Schupp is a 64 y.o. male, with a PMhx of HTN and mental retardation, who presents to the Emergency Department with his sister who states that the pt has had a constant, moderate, dry cough X 2-3 days. Level 5 caveat, mental retardation. History is provided by his sister who accompanies him to this visit. The pt has been around sick contacts who have been ill with an URI. He is eating and drinking appropriately, per relative. Relative denies the cough to be productive, fevers, SOB, sore throat, otalgia, or rhinorrhea. No known history of strep throat, bronchitis, or PNA. Pt afebrile on triage vitals.   PCP: Dr. Concepcion ElkAvbuere Past Medical History  Diagnosis Date  . Hypertension   . Mental retardation   . Seizures (HCC)    History reviewed. No pertinent past surgical history. History reviewed. No pertinent family history. Social History  Substance Use Topics  . Smoking status: Never Smoker   . Smokeless tobacco: Never Used  . Alcohol Use: No    Review of Systems  Constitutional: Negative for fever.  HENT: Negative for ear pain, rhinorrhea and sore throat.   Respiratory: Positive for cough. Negative for shortness of breath.   All other systems reviewed and are negative.  Allergies  Review of patient's allergies indicates no known allergies.  Home Medications   Prior to Admission medications   Medication Sig Start Date End Date Taking? Authorizing Provider  carbamazepine (TEGRETOL) 200 MG tablet Take 400 mg by mouth 3 (three) times daily.     Historical Provider, MD  gabapentin (NEURONTIN) 300 MG capsule Take 300 mg by mouth 3 (three) times daily.    Historical Provider, MD  lisinopril (PRINIVIL,ZESTRIL) 20 MG tablet  12/29/14   Historical Provider, MD  lisinopril (PRINIVIL,ZESTRIL) 40 MG tablet  01/07/15   Historical Provider, MD  pantoprazole (PROTONIX) 40 MG tablet  12/29/14   Historical Provider, MD  simvastatin (ZOCOR) 20 MG tablet Take 20 mg by mouth every evening.    Historical Provider, MD  Vitamin D, Ergocalciferol, (DRISDOL) 50000 UNITS CAPS capsule Take 50,000 Units by mouth every 7 (seven) days. Every Friday    Historical Provider, MD   BP 173/91 mmHg  Pulse 71  Temp(Src) 98.4 F (36.9 C) (Oral)  Resp 16  Ht 6' (1.829 m)  Wt 179 lb (81.194 kg)  BMI 24.27 kg/m2  SpO2 100% Physical Exam  Constitutional: He is oriented to person, place, and time. He appears well-developed and well-nourished. No distress.  HENT:  Head: Normocephalic and atraumatic.  Right Ear: External ear normal.  Left Ear: External ear normal.  Nose: Nose normal.  Mouth/Throat: Oropharynx is clear and moist. No oropharyngeal exudate.  TMs clear bilaterally.  Eyes: Conjunctivae are normal. Pupils are equal, round, and reactive to light. Right eye exhibits no discharge. Left eye exhibits no discharge. No scleral icterus.  Neck: Neck supple.  Cardiovascular: Normal rate, regular rhythm, normal heart sounds and intact distal pulses.  Exam reveals no gallop and no friction rub.   No murmur heard. Pulmonary/Chest: Effort  normal and breath sounds normal. No respiratory distress. He has no wheezes. He has no rales. He exhibits no tenderness.  Abdominal: Soft.  Lymphadenopathy:    He has no cervical adenopathy.  Neurological: He is alert and oriented to person, place, and time. Coordination normal.  Skin: Skin is warm and dry. No rash noted. He is not diaphoretic. No erythema. No pallor.  Nursing note and vitals reviewed.   ED Course  Procedures   DIAGNOSTIC STUDIES: Oxygen Saturation is 100% on RA, normal by my interpretation.    COORDINATION OF CARE: 9:20 AM Discussed treatment plan with relative at bedside and relative agreed to plan.  MDM   Final diagnoses:  Cough   Patients symptoms are consistent with URI, likely viral etiology. Pt in NAD in ED. VSS. No reported fever. Lungs CTAB. No hypoxia or tachycardia. No accessory muscle use or retractions. Discussed that antibiotics are not indicated for viral infections. Pt will be discharged with symptomatic treatment.  Pt guardian Verbalizes understanding and is agreeable with plan. Pt is hemodynamically stable & in NAD prior to dc.   I personally performed the services described in this documentation, which was scribed in my presence. The recorded information has been reviewed and is accurate.     Lester Kinsman [place], PA-C 07/22/15 1409  Azalia Bilis, MD 07/23/15 [phone]

## 2015-07-24 ENCOUNTER — Ambulatory Visit: Payer: Medicare Other | Admitting: Podiatry

## 2015-07-31 ENCOUNTER — Ambulatory Visit (INDEPENDENT_AMBULATORY_CARE_PROVIDER_SITE_OTHER): Payer: Medicare Other | Admitting: Podiatry

## 2015-07-31 ENCOUNTER — Encounter: Payer: Self-pay | Admitting: Podiatry

## 2015-07-31 DIAGNOSIS — B351 Tinea unguium: Secondary | ICD-10-CM

## 2015-07-31 DIAGNOSIS — M79676 Pain in unspecified toe(s): Secondary | ICD-10-CM | POA: Diagnosis not present

## 2015-08-01 NOTE — Progress Notes (Signed)
Patient ID: Craig Yates, male   DOB: May 17, 1951, 64 y.o.   MRN: [id number]  Subjective: This patient presents for a scheduled visit with patient's representative present in treatment room. This patient has a history of ongoing painful toenails when walking wearing shoes and presents again for debridement  Objective: Patient is able to respond to questioning The toenails are elongated, incurvated, discolored, brittle, deformed and tender direct palpation 6-10  Assessment: Symptomatic onychomycosis 6-10 History of mental retardation  Plan: Debridement toenails 10 mechanically and electrically without any bleeding  Reappoint 3 months

## 2015-10-30 ENCOUNTER — Encounter: Payer: Self-pay | Admitting: Podiatry

## 2015-10-30 ENCOUNTER — Ambulatory Visit (INDEPENDENT_AMBULATORY_CARE_PROVIDER_SITE_OTHER): Payer: Medicare Other | Admitting: Podiatry

## 2015-10-30 DIAGNOSIS — B351 Tinea unguium: Secondary | ICD-10-CM

## 2015-10-30 DIAGNOSIS — M79676 Pain in unspecified toe(s): Secondary | ICD-10-CM

## 2015-10-31 NOTE — Progress Notes (Signed)
Patient ID: Craig Yates, male   DOB: 10/18/1950, 65 y.o.   MRN: [id number]  Subjective: This patient presents for a scheduled visit with patient's representative present in treatment room. This patient has a history of ongoing painful toenails when walking wearing shoes and presents again for debridement. The patient's sister is present in the treatment room today  Objective: Patient is able to respond to questioning The toenails are elongated, incurvated, discolored, brittle, deformed and tender direct palpation 6-10  Assessment: Symptomatic onychomycosis 6-10 History of mental retardation  Plan: Debridement toenails 10 mechanically and electrically without any bleeding  Reappoint 3 months          

## 2016-02-05 ENCOUNTER — Encounter (INDEPENDENT_AMBULATORY_CARE_PROVIDER_SITE_OTHER): Payer: Medicare Other | Admitting: Podiatry

## 2016-02-05 NOTE — Progress Notes (Signed)
This encounter was created in error - please disregard.

## 2016-03-10 ENCOUNTER — Encounter: Payer: Self-pay | Admitting: Podiatry

## 2016-03-10 ENCOUNTER — Ambulatory Visit (INDEPENDENT_AMBULATORY_CARE_PROVIDER_SITE_OTHER): Payer: Medicare Other | Admitting: Podiatry

## 2016-03-10 DIAGNOSIS — M79676 Pain in unspecified toe(s): Secondary | ICD-10-CM | POA: Diagnosis not present

## 2016-03-10 DIAGNOSIS — B351 Tinea unguium: Secondary | ICD-10-CM | POA: Diagnosis not present

## 2016-03-10 NOTE — Progress Notes (Signed)
Patient ID: Craig Yates, male   DOB: May 27, 1951, 65 y.o.   MRN: [id number]  Subjective: This patient presents for a scheduled visit with patient's representative present in treatment room. This patient has a history of ongoing painful toenails when walking wearing shoes and presents again for debridement. The patient's sister is present in the treatment room today  Objective: Patient is able to respond to questioning The toenails are elongated, incurvated, discolored, brittle, deformed and tender direct palpation 6-10  Assessment: Symptomatic onychomycosis 6-10 History of mental retardation  Plan: Debridement toenails 10 mechanically and electrically without any bleeding  Reappoint 3 months

## 2016-06-09 ENCOUNTER — Encounter: Payer: Self-pay | Admitting: Podiatry

## 2016-06-09 ENCOUNTER — Ambulatory Visit (INDEPENDENT_AMBULATORY_CARE_PROVIDER_SITE_OTHER): Payer: Medicare Other | Admitting: Podiatry

## 2016-06-09 VITALS — BP 166/96 | HR 67 | Resp 14

## 2016-06-09 DIAGNOSIS — B351 Tinea unguium: Secondary | ICD-10-CM | POA: Diagnosis not present

## 2016-06-09 DIAGNOSIS — M79676 Pain in unspecified toe(s): Secondary | ICD-10-CM | POA: Diagnosis not present

## 2016-06-09 NOTE — Progress Notes (Signed)
Patient ID: Craig Yates, male   DOB: 1951-01-05, 65 y.o.   MRN: [id number]  Subjective: This patient presents for a scheduled visit with patient's representative present in treatment room. This patient has a history of ongoing painful toenails when walking wearing shoes and presents again for debridement. The patient's sister is present in the treatment room today  Objective: Patient is able to respond to questioning Open skin lesions bilaterally DP and PT pulses 2/4 bilaterally Capillary reflex immediate bilaterally The toenails are elongated, incurvated, discolored, brittle, deformed and tender direct palpation 6-10  Assessment: Symptomatic onychomycosis 6-10 History of mental retardation  Plan: Debridement toenails 64 mechanically and electrically without any bleeding

## 2016-09-09 ENCOUNTER — Encounter: Payer: Self-pay | Admitting: Podiatry

## 2016-09-09 ENCOUNTER — Ambulatory Visit (INDEPENDENT_AMBULATORY_CARE_PROVIDER_SITE_OTHER): Payer: Medicare Other | Admitting: Podiatry

## 2016-09-09 VITALS — BP 171/95 | HR 68 | Resp 18

## 2016-09-09 DIAGNOSIS — B351 Tinea unguium: Secondary | ICD-10-CM

## 2016-09-09 DIAGNOSIS — M79676 Pain in unspecified toe(s): Secondary | ICD-10-CM

## 2016-09-09 NOTE — Progress Notes (Signed)
Patient ID: Craig Yates, male   DOB: 1951-02-04, 66 y.o.   MRN: [id number]    Subjective: This patient presents for a scheduled visit with patient's representative present in treatment room. This patient has a history of ongoing painful toenails when walking wearing shoes and presents again for debridement. The patient's sister is present in the treatment room today  Objective: Patient is able to respond to questioning Open skin lesions bilaterally DP and PT pulses 2/4 bilaterally Capillary reflex immediate bilaterally Sensation to 10 g monofilament wire intact 5/5 bilaterally Vibratory sensation intact bilaterally Ankle reflexes reactive bilaterally No open skin lesions bilaterally Dry skin bilaterally The toenails are elongated, incurvated, discolored, brittle, deformed and tender direct palpation 6-10 No deformities bilaterally No restriction ankle, subtalar, midtarsal joints bilaterally Assessment: Symptomatic onychomycosis 6-10 History of mental retardation  Plan: Debridement toenails 10 mechanically and electrically without any bleeding  Reappoint 3 months

## 2016-10-29 ENCOUNTER — Emergency Department (HOSPITAL_COMMUNITY)
Admission: EM | Admit: 2016-10-29 | Discharge: 2016-10-30 | Disposition: A | Discharge status: Home / Self Care | Payer: Medicare Other | Attending: Emergency Medicine | Admitting: Emergency Medicine

## 2016-10-29 DIAGNOSIS — J069 Acute upper respiratory infection, unspecified: Secondary | ICD-10-CM | POA: Diagnosis not present

## 2016-10-29 DIAGNOSIS — J328 Other chronic sinusitis: Secondary | ICD-10-CM | POA: Insufficient documentation

## 2016-10-29 DIAGNOSIS — R4182 Altered mental status, unspecified: Secondary | ICD-10-CM | POA: Insufficient documentation

## 2016-10-29 DIAGNOSIS — I10 Essential (primary) hypertension: Secondary | ICD-10-CM | POA: Diagnosis not present

## 2016-10-29 DIAGNOSIS — R5383 Other fatigue: Secondary | ICD-10-CM | POA: Diagnosis present

## 2016-10-29 DIAGNOSIS — J329 Chronic sinusitis, unspecified: Secondary | ICD-10-CM

## 2016-10-29 NOTE — ED Notes (Signed)
Ward, MD at bedside. 

## 2016-10-29 NOTE — ED Provider Notes (Signed)
By signing my name below, I, Craig Yates, attest that this documentation has been prepared under the direction and in the presence of Craig Yates N Craig Booze, DO. Electronically Signed: Doreatha Yates, ED Scribe. 10/29/16. 11:59 PM.   TIME SEEN: 11:47 PM   CHIEF COMPLAINT:  Chief Complaint  Patient presents with  . Fatigue    LEVEL 5 CAVEAT: HPI and ROS limited due to mental retardation    HPI: history provided by sister   HPI Comments: Craig Yates is a 66 y.o. male with h/o HTN, mental retardation who presents to the Emergency Department for evaluation of intermittent fatigue that began this morning. Per family, the pt has been bobbing his head and falling asleep throughout the day today, so they became concerned. Family states that he had to be called a few times to gain his attention during these episodes of fatigue. Family denies syncope. They state this is a change in his behavior but that he appears at his baseline currently.  No recent head injuries or changes in medications. Family also reports a cough that began this week. They report the pt is eating and drinking normally. Pt obtained a flu shot this year. The pt lives with his sister. Family denies recent vomiting, diarrhea and pt denies any pain.   Unable to perform ROS: due to mental retardation    PAST MEDICAL HISTORY/PAST SURGICAL HISTORY:  Past Medical History:  Diagnosis Date  . Hypertension   . Mental retardation   . Seizures (HCC)     MEDICATIONS:  Prior to Admission medications   Medication Sig Start Date End Date Taking? Authorizing Provider  carbamazepine (TEGRETOL) 200 MG tablet Take 400 mg by mouth 3 (three) times daily.    Historical Provider, MD  gabapentin (NEURONTIN) 300 MG capsule Take 300 mg by mouth 3 (three) times daily.    Historical Provider, MD  lisinopril (PRINIVIL,ZESTRIL) 20 MG tablet  12/29/14   Historical Provider, MD  lisinopril (PRINIVIL,ZESTRIL) 40 MG tablet  01/07/15   Historical Provider, MD   pantoprazole (PROTONIX) 40 MG tablet  12/29/14   Historical Provider, MD  simvastatin (ZOCOR) 20 MG tablet Take 20 mg by mouth every evening.    Historical Provider, MD  Vitamin D, Ergocalciferol, (DRISDOL) 50000 UNITS CAPS capsule Take 50,000 Units by mouth every 7 (seven) days. Every Friday    Historical Provider, MD    ALLERGIES:  No Known Allergies  SOCIAL HISTORY:  Social History  Substance Use Topics  . Smoking status: Never Smoker  . Smokeless tobacco: Never Used  . Alcohol use No    FAMILY HISTORY: No family history on file.  EXAM: BP 141/75 (BP Location: Left Arm)   Pulse 80   Temp 99.6 F (37.6 C) (Oral)   Resp 16   SpO2 100%  CONSTITUTIONAL: Alert, oriented to person and place, but not year, which is his baseline. Elderly chronically-ill appearing. Low-grade oral temperature of 99.6. HEAD: Normocephalic, atraumatic EYES: Conjunctivae clear, PERRL, EOMI ENT: normal nose; no rhinorrhea; moist mucous membranes; No pharyngeal erythema or petechiae, no tonsillar hypertrophy or exudate, no uvular deviation, no unilateral swelling, no trismus or drooling, no muffled voice, normal phonation, no stridor, no dental caries present the patient does have poor dentition, no drainable dental abscess noted, no Ludwig's angina, tongue sits flat in the bottom of the mouth, no angioedema, no facial erythema or warmth, no facial swelling; no pain with movement of the neck NECK: Supple, no meningismus, no nuchal rigidity, no LAD  CARD:  RRR; S1 and S2 appreciated; no murmurs, no clicks, no rubs, no gallops RESP: Normal chest excursion without splinting or tachypnea; breath sounds clear and equal bilaterally; no wheezes, no rhonchi, no rales, no hypoxia or respiratory distress, speaking full sentences ABD/GI: Normal bowel sounds; non-distended; soft, non-tender, no rebound, no guarding, no peritoneal signs, no hepatosplenomegaly BACK:  The back appears normal and is non-tender to palpation,  there is no CVA tenderness, no midline spinal tenderness or step-off or deformity EXT: Normal ROM in all joints; non-tender to palpation; no edema; normal capillary refill; no cyanosis, no calf tenderness or swelling    SKIN: Normal color for age and race; warm; no rash NEURO: Moves all extremities equally, sensation to light touch intact diffusely, cranial nerves II through XII intact, slightly slurred speech which he reports is baseline PSYCH: The patient's mood and manner are appropriate. Grooming and personal hygiene are appropriate.  MEDICAL DECISION MAKING: Patient here with increased drowsiness, change in mental status. Currently at his baseline. No focal neurologic deficits but history and exam are very limited given patient's mental retardation and family members at bedside that are very poor historians. We'll obtain CT of the head to evaluate for any possible intracranial hemorrhage although I feel this is unlikely. Likely upper respiratory viral illness given low-grade oral temperature here and call for the past week. We'll obtain chest x-ray, labs, urine. EKG shows no new ischemic abnormality.  ED PROGRESS: 2:05 AM  Patient's workup has been unremarkable other than mildly low potassium at 3.0. We have given oral replacement. Also mildly hyponatremic. Appeared slightly dry on exam and was given a liter of IV fluids. Normal glucose. Negative troponin. Normal lactate. Chest x-ray shows no infiltrate or edema. Urine shows no sign of infection and no ketones. CT of the head shows no acute abnormality. There is acute on chronic sinus inflammation. Suspect that he has sinusitis, upper respiratory infection causing his cough and likely viral in nature. I do not feel he needs antibiotics at this time. Recommended alternating Tylenol and Motrin for fever, pain. Recommend increased fluid intake at home and rest. Recommended follow-up with primary care provider for reevaluation next week. Family comfortable  with this plan.   At this time, I do not feel there is any life-threatening condition present. I have reviewed and discussed all results (EKG, imaging, lab, urine as appropriate) and exam findings with patient/family. I have reviewed nursing notes and appropriate previous records.  I feel the patient is safe to be discharged home without further emergent workup and can continue workup as an outpatient as needed. Discussed usual and customary return precautions. Patient/family verbalize understanding and are comfortable with this plan.  Outpatient follow-up has been provided. All questions have been answered.     EKG Interpretation  Date/Time:  Thursday October 29 2016 23:53:46 EST Ventricular Rate:  78 PR Interval:    QRS Duration: 96 QT Interval:  380 QTC Calculation: 433 R Axis:   41 Text Interpretation:  Sinus rhythm Abnormal R-wave progression, early transition Borderline ST elevation, anterior leads No significant change since last tracing Confirmed by Jodie Cavey,  DO, Yotam Rhine [phone]([id number]) on 10/30/2016 12:03:11 AM       I personally performed the services described in this documentation, which was scribed in my presence. The recorded information has been reviewed and is accurate.    Layla MawKristen N Zairah Arista, DO 10/30/16 [phone]

## 2016-10-29 NOTE — ED Triage Notes (Signed)
Per [facility] home pt has mild Mr, pt has cold and pt was getting drowsy and falling asleep and waking. Pt appropriate per normal mental status, knows name and family members

## 2016-10-29 NOTE — ED Triage Notes (Signed)
Per [facility] pt took mucinex and has been drowsy since, has a cold

## 2016-10-30 ENCOUNTER — Emergency Department (HOSPITAL_COMMUNITY): Payer: Medicare Other

## 2016-10-30 DIAGNOSIS — J069 Acute upper respiratory infection, unspecified: Secondary | ICD-10-CM | POA: Diagnosis not present

## 2016-10-30 LAB — I-STAT TROPONIN, ED: TROPONIN I, POC: 0 ng/mL (ref 0.00–0.08)

## 2016-10-30 LAB — COMPREHENSIVE METABOLIC PANEL
ALK PHOS: 96 U/L (ref 38–126)
ALT: 30 U/L (ref 17–63)
ANION GAP: 9 (ref 5–15)
AST: 38 U/L (ref 15–41)
Albumin: 3.3 g/dL — ABNORMAL LOW (ref 3.5–5.0)
BILIRUBIN TOTAL: 0.4 mg/dL (ref 0.3–1.2)
BUN: 20 mg/dL (ref 6–20)
CO2: 22 mmol/L (ref 22–32)
Calcium: 6.9 mg/dL — ABNORMAL LOW (ref 8.9–10.3)
Chloride: 96 mmol/L — ABNORMAL LOW (ref 101–111)
Creatinine, Ser: 0.98 mg/dL (ref 0.61–1.24)
Glucose, Bld: 114 mg/dL — ABNORMAL HIGH (ref 65–99)
Potassium: 3 mmol/L — ABNORMAL LOW (ref 3.5–5.1)
SODIUM: 127 mmol/L — AB (ref 135–145)
TOTAL PROTEIN: 6.4 g/dL — AB (ref 6.5–8.1)

## 2016-10-30 LAB — CBC WITH DIFFERENTIAL/PLATELET
Basophils Absolute: 0 10*3/uL (ref 0.0–0.1)
Basophils Relative: 0 %
EOS ABS: 0 10*3/uL (ref 0.0–0.7)
Eosinophils Relative: 0 %
HEMATOCRIT: 28.5 % — AB (ref 39.0–52.0)
Hemoglobin: 10 g/dL — ABNORMAL LOW (ref 13.0–17.0)
LYMPHS ABS: 1.2 10*3/uL (ref 0.7–4.0)
Lymphocytes Relative: 15 %
MCH: 29.1 pg (ref 26.0–34.0)
MCHC: 35.1 g/dL (ref 30.0–36.0)
MCV: 82.8 fL (ref 78.0–100.0)
MONOS PCT: 12 %
Monocytes Absolute: 0.9 10*3/uL (ref 0.1–1.0)
Neutro Abs: 5.8 10*3/uL (ref 1.7–7.7)
Neutrophils Relative %: 73 %
Platelets: 170 10*3/uL (ref 150–400)
RBC: 3.44 MIL/uL — ABNORMAL LOW (ref 4.22–5.81)
RDW: 13.7 % (ref 11.5–15.5)
WBC: 7.9 10*3/uL (ref 4.0–10.5)

## 2016-10-30 LAB — URINALYSIS, ROUTINE W REFLEX MICROSCOPIC
Bacteria, UA: NONE SEEN
Bilirubin Urine: NEGATIVE
Glucose, UA: NEGATIVE mg/dL
Hgb urine dipstick: NEGATIVE
Ketones, ur: NEGATIVE mg/dL
Leukocytes, UA: NEGATIVE
Nitrite: NEGATIVE
PH: 5 (ref 5.0–8.0)
Protein, ur: 30 mg/dL — AB
SPECIFIC GRAVITY, URINE: 1.016 (ref 1.005–1.030)

## 2016-10-30 LAB — I-STAT CG4 LACTIC ACID, ED: LACTIC ACID, VENOUS: 1.27 mmol/L (ref 0.5–1.9)

## 2016-10-30 LAB — CBG MONITORING, ED: GLUCOSE-CAPILLARY: 118 mg/dL — AB (ref 65–99)

## 2016-10-30 MED ORDER — SODIUM CHLORIDE 0.9 % IV BOLUS (SEPSIS)
1000.0000 mL | Freq: Once | INTRAVENOUS | Status: AC
  Administered 2016-10-30: 1000 mL via INTRAVENOUS

## 2016-10-30 MED ORDER — POTASSIUM CHLORIDE CRYS ER 20 MEQ PO TBCR
40.0000 meq | EXTENDED_RELEASE_TABLET | Freq: Once | ORAL | Status: AC
  Administered 2016-10-30: 40 meq via ORAL
  Filled 2016-10-30: qty 2

## 2016-10-30 MED ORDER — ACETAMINOPHEN 500 MG PO TABS
1000.0000 mg | ORAL_TABLET | Freq: Once | ORAL | Status: AC
  Administered 2016-10-30: 1000 mg via ORAL
  Filled 2016-10-30: qty 2

## 2016-10-30 NOTE — Discharge Instructions (Signed)
You may alternate between Tylenol 1000 mg every 6 hours as needed for fever and pain and ibuprofen 600 mg every 8 hours as needed for fever and pain. Labs today were normal. Urine showed no sign of infection. Chest x-ray showed no pneumonia. Symptoms likely caused by a virus that does not need antibiotics to get better. Please follow-up with your primary care provider if symptoms are not improving.

## 2016-12-09 ENCOUNTER — Encounter: Payer: Self-pay | Admitting: Podiatry

## 2016-12-09 ENCOUNTER — Ambulatory Visit (INDEPENDENT_AMBULATORY_CARE_PROVIDER_SITE_OTHER): Payer: Medicare Other | Admitting: Podiatry

## 2016-12-09 DIAGNOSIS — M79676 Pain in unspecified toe(s): Secondary | ICD-10-CM

## 2016-12-09 DIAGNOSIS — B351 Tinea unguium: Secondary | ICD-10-CM

## 2016-12-09 NOTE — Progress Notes (Signed)
Patient ID: Craig Yates, male   DOB: 11/03/1950, 66 y.o.   MRN: [id number]    Subjective: This patient presents for a scheduled visit . This patient has a history of ongoing painful toenails when walking wearing shoes and presents again for debridement. The patient's sister is present in the treatment room today  Objective: Patient is able to respond to questioning Open skin lesions bilaterally DP and PT pulses 2/4 bilaterally Capillary reflex immediate bilaterally Sensation to 10 g monofilament wire intact 5/5 bilaterally Vibratory sensation intact bilaterally Ankle reflexes reactive bilaterally No open skin lesions bilaterally Dry skin bilaterally Absent hair growth bilaterally The toenails are elongated, incurvated, discolored, brittle, deformed and tender direct palpation 6-10 No deformities bilaterally No restriction ankle, subtalar, midtarsal joints bilaterally Assessment: Symptomatic onychomycosis 6-10 History of mental retardation  Plan: Debridement toenails 10 mechanically and electrically without any bleeding  Reappoint 3 months 

## 2017-03-10 ENCOUNTER — Ambulatory Visit: Payer: Medicare Other | Admitting: Podiatry

## 2017-03-24 ENCOUNTER — Encounter: Payer: Self-pay | Admitting: Podiatry

## 2017-03-24 ENCOUNTER — Ambulatory Visit (INDEPENDENT_AMBULATORY_CARE_PROVIDER_SITE_OTHER): Payer: Medicare Other | Admitting: Podiatry

## 2017-03-24 DIAGNOSIS — B351 Tinea unguium: Secondary | ICD-10-CM

## 2017-03-24 DIAGNOSIS — M79676 Pain in unspecified toe(s): Secondary | ICD-10-CM

## 2017-03-24 NOTE — Progress Notes (Signed)
Patient ID: Craig Yates, male   DOB: 05-14-1951, 66 y.o.   MRN: [id number]    Subjective: This patient presents for a scheduled visit . This patient has a history of ongoing painful toenails when walking wearing shoes and presents again for debridement. The patient's sister is present in the treatment room today  Objective: Patient is able to respond to questioning Open skin lesions bilaterally DP and PT pulses 2/4 bilaterally Capillary reflex immediate bilaterally Sensation to 10 g monofilament wire intact 5/5 bilaterally Vibratory sensation intact bilaterally Ankle reflexes reactive bilaterally No open skin lesions bilaterally Dry skin bilaterally Absent hair growth bilaterally The toenails are elongated, incurvated, discolored, brittle, deformed and tender direct palpation 6-10 No deformities bilaterally No restriction ankle, subtalar, midtarsal joints bilaterally Assessment: Symptomatic onychomycosis 6-10 History of mental retardation  Plan: Debridement toenails 10 mechanically and electrically without any bleeding  Reappoint 3 months

## 2017-06-29 ENCOUNTER — Encounter: Payer: Self-pay | Admitting: Podiatry

## 2017-06-29 ENCOUNTER — Ambulatory Visit (INDEPENDENT_AMBULATORY_CARE_PROVIDER_SITE_OTHER): Payer: Medicare Other | Admitting: Podiatry

## 2017-06-29 DIAGNOSIS — B351 Tinea unguium: Secondary | ICD-10-CM | POA: Diagnosis not present

## 2017-06-29 DIAGNOSIS — M79674 Pain in right toe(s): Secondary | ICD-10-CM

## 2017-06-29 DIAGNOSIS — M79675 Pain in left toe(s): Secondary | ICD-10-CM

## 2017-06-29 NOTE — Progress Notes (Signed)
Patient ID: Craig Yates, male   DOB: 05/13/1951, 66 y.o.   MRN: [id number]    Subjective: This patient presents for a scheduled visit . This patient has a history of ongoing painful toenails when walking wearing shoes and presents again for debridement. The patient's sister is present in the treatment room today  Objective: Patient is able to respond to questioning Open skin lesions bilaterally DP and PT pulses 2/4 bilaterally Capillary reflex immediate bilaterally Sensation to 10 g monofilament wire intact 5/5 bilaterally Vibratory sensation intact bilaterally Ankle reflexes reactive bilaterally No open skin lesions bilaterally Dry skin bilaterally Absent hair growth bilaterally The toenails are elongated, incurvated, discolored, brittle, deformed and tender direct palpation 6-10 No deformities bilaterally No restriction ankle, subtalar, midtarsal joints bilaterally Assessment: Symptomatic onychomycosis 6-10 History of mental retardation  Plan: Debridement toenails 10 mechanically and electrically without any bleeding  Reappoint 3 months 

## 2017-08-05 ENCOUNTER — Encounter (HOSPITAL_COMMUNITY): Payer: Self-pay | Admitting: Emergency Medicine

## 2017-08-05 ENCOUNTER — Other Ambulatory Visit: Payer: Self-pay

## 2017-08-05 ENCOUNTER — Inpatient Hospital Stay (HOSPITAL_COMMUNITY)
Admission: EM | Admit: 2017-08-05 | Discharge: 2017-08-09 | DRG: 178 | Disposition: A | Discharge status: Home / Self Care | Payer: Medicare Other | Attending: Internal Medicine | Admitting: Internal Medicine

## 2017-08-05 ENCOUNTER — Emergency Department (HOSPITAL_COMMUNITY): Payer: Medicare Other

## 2017-08-05 DIAGNOSIS — R569 Unspecified convulsions: Secondary | ICD-10-CM

## 2017-08-05 DIAGNOSIS — E876 Hypokalemia: Secondary | ICD-10-CM | POA: Diagnosis present

## 2017-08-05 DIAGNOSIS — E871 Hypo-osmolality and hyponatremia: Secondary | ICD-10-CM | POA: Diagnosis present

## 2017-08-05 DIAGNOSIS — E860 Dehydration: Secondary | ICD-10-CM | POA: Diagnosis not present

## 2017-08-05 DIAGNOSIS — R748 Abnormal levels of other serum enzymes: Secondary | ICD-10-CM | POA: Diagnosis present

## 2017-08-05 DIAGNOSIS — I10 Essential (primary) hypertension: Secondary | ICD-10-CM | POA: Diagnosis not present

## 2017-08-05 DIAGNOSIS — R0602 Shortness of breath: Secondary | ICD-10-CM

## 2017-08-05 DIAGNOSIS — Z79899 Other long term (current) drug therapy: Secondary | ICD-10-CM

## 2017-08-05 DIAGNOSIS — E785 Hyperlipidemia, unspecified: Secondary | ICD-10-CM | POA: Diagnosis not present

## 2017-08-05 DIAGNOSIS — F79 Unspecified intellectual disabilities: Secondary | ICD-10-CM | POA: Diagnosis present

## 2017-08-05 DIAGNOSIS — D638 Anemia in other chronic diseases classified elsewhere: Secondary | ICD-10-CM | POA: Diagnosis present

## 2017-08-05 DIAGNOSIS — J189 Pneumonia, unspecified organism: Secondary | ICD-10-CM | POA: Diagnosis present

## 2017-08-05 DIAGNOSIS — G40909 Epilepsy, unspecified, not intractable, without status epilepticus: Secondary | ICD-10-CM | POA: Diagnosis not present

## 2017-08-05 DIAGNOSIS — Y92009 Unspecified place in unspecified non-institutional (private) residence as the place of occurrence of the external cause: Secondary | ICD-10-CM | POA: Diagnosis not present

## 2017-08-05 DIAGNOSIS — K219 Gastro-esophageal reflux disease without esophagitis: Secondary | ICD-10-CM | POA: Diagnosis not present

## 2017-08-05 DIAGNOSIS — J690 Pneumonitis due to inhalation of food and vomit: Secondary | ICD-10-CM | POA: Diagnosis not present

## 2017-08-05 DIAGNOSIS — T421X1A Poisoning by iminostilbenes, accidental (unintentional), initial encounter: Secondary | ICD-10-CM | POA: Diagnosis present

## 2017-08-05 DIAGNOSIS — J181 Lobar pneumonia, unspecified organism: Secondary | ICD-10-CM | POA: Diagnosis not present

## 2017-08-05 HISTORY — DX: Lobar pneumonia, unspecified organism: J18.1

## 2017-08-05 HISTORY — DX: Poisoning by iminostilbenes, accidental (unintentional), initial encounter: T42.1X1A

## 2017-08-05 HISTORY — DX: Hypo-osmolality and hyponatremia: E87.1

## 2017-08-05 HISTORY — DX: Gastro-esophageal reflux disease without esophagitis: K21.9

## 2017-08-05 LAB — OSMOLALITY, URINE: OSMOLALITY UR: 246 mosm/kg — AB (ref 300–900)

## 2017-08-05 LAB — URINALYSIS, ROUTINE W REFLEX MICROSCOPIC
Bacteria, UA: NONE SEEN
Bilirubin Urine: NEGATIVE
GLUCOSE, UA: NEGATIVE mg/dL
KETONES UR: NEGATIVE mg/dL
LEUKOCYTES UA: NEGATIVE
Nitrite: NEGATIVE
PH: 6 (ref 5.0–8.0)
Protein, ur: NEGATIVE mg/dL
Specific Gravity, Urine: 1.009 (ref 1.005–1.030)

## 2017-08-05 LAB — BASIC METABOLIC PANEL
Anion gap: 13 (ref 5–15)
BUN: 7 mg/dL (ref 6–20)
CALCIUM: 8.3 mg/dL — AB (ref 8.9–10.3)
CHLORIDE: 84 mmol/L — AB (ref 101–111)
CO2: 21 mmol/L — AB (ref 22–32)
CREATININE: 0.93 mg/dL (ref 0.61–1.24)
GFR calc Af Amer: >60 mL/min (ref >60)
GFR calc non Af Amer: >60 mL/min (ref >60)
GLUCOSE: 97 mg/dL (ref 65–99)
Potassium: 3.7 mmol/L (ref 3.5–5.1)
Sodium: 118 mmol/L — CL (ref 135–145)

## 2017-08-05 LAB — CBC WITH DIFFERENTIAL/PLATELET
Basophils Absolute: 0 10*3/uL (ref 0.0–0.1)
Basophils Relative: 0 %
Eosinophils Absolute: 0.1 10*3/uL (ref 0.0–0.7)
Eosinophils Relative: 1 %
HEMATOCRIT: 30 % — AB (ref 39.0–52.0)
HEMOGLOBIN: 10.9 g/dL — AB (ref 13.0–17.0)
LYMPHS ABS: 0.8 10*3/uL (ref 0.7–4.0)
Lymphocytes Relative: 10 %
MCH: 30.5 pg (ref 26.0–34.0)
MCHC: 36.3 g/dL — AB (ref 30.0–36.0)
MCV: 84 fL (ref 78.0–100.0)
MONOS PCT: 10 %
Monocytes Absolute: 0.8 10*3/uL (ref 0.1–1.0)
NEUTROS ABS: 6.4 10*3/uL (ref 1.7–7.7)
NEUTROS PCT: 79 %
Platelets: 224 10*3/uL (ref 150–400)
RBC: 3.57 MIL/uL — ABNORMAL LOW (ref 4.22–5.81)
RDW: 12.8 % (ref 11.5–15.5)
WBC: 8.2 10*3/uL (ref 4.0–10.5)

## 2017-08-05 LAB — GLUCOSE, CAPILLARY: Glucose-Capillary: 117 mg/dL — ABNORMAL HIGH (ref 65–99)

## 2017-08-05 LAB — COMPREHENSIVE METABOLIC PANEL
ALBUMIN: 3.8 g/dL (ref 3.5–5.0)
ALK PHOS: 156 U/L — AB (ref 38–126)
ALT: 41 U/L (ref 17–63)
ANION GAP: 15 (ref 5–15)
AST: 37 U/L (ref 15–41)
BILIRUBIN TOTAL: 0.9 mg/dL (ref 0.3–1.2)
BUN: 8 mg/dL (ref 6–20)
CALCIUM: 8.2 mg/dL — AB (ref 8.9–10.3)
CO2: 21 mmol/L — ABNORMAL LOW (ref 22–32)
Chloride: 80 mmol/L — ABNORMAL LOW (ref 101–111)
Creatinine, Ser: 1.05 mg/dL (ref 0.61–1.24)
GFR calc non Af Amer: >60 mL/min (ref >60)
GLUCOSE: 108 mg/dL — AB (ref 65–99)
Potassium: 3.5 mmol/L (ref 3.5–5.1)
Sodium: 116 mmol/L — CL (ref 135–145)
TOTAL PROTEIN: 7.8 g/dL (ref 6.5–8.1)

## 2017-08-05 LAB — CARBAMAZEPINE LEVEL, TOTAL: CARBAMAZEPINE LVL: 14.4 ug/mL — AB (ref 4.0–12.0)

## 2017-08-05 LAB — TSH: TSH: 1.213 u[IU]/mL (ref 0.350–4.500)

## 2017-08-05 LAB — I-STAT TROPONIN, ED: Troponin i, poc: 0 ng/mL (ref 0.00–0.08)

## 2017-08-05 LAB — OSMOLALITY: Osmolality: 245 mOsm/kg — CL (ref 275–295)

## 2017-08-05 LAB — NA AND K (SODIUM & POTASSIUM), RAND UR
POTASSIUM UR: 18 mmol/L
SODIUM UR: 27 mmol/L

## 2017-08-05 LAB — CORTISOL-AM, BLOOD: CORTISOL - AM: 15.8 ug/dL (ref 6.7–22.6)

## 2017-08-05 LAB — STREP PNEUMONIAE URINARY ANTIGEN: Strep Pneumo Urinary Antigen: NEGATIVE

## 2017-08-05 MED ORDER — LEVOFLOXACIN IN D5W 750 MG/150ML IV SOLN
750.0000 mg | INTRAVENOUS | Status: DC
  Administered 2017-08-06 – 2017-08-08 (×3): 750 mg via INTRAVENOUS
  Filled 2017-08-05 (×4): qty 150

## 2017-08-05 MED ORDER — GABAPENTIN 300 MG PO CAPS
300.0000 mg | ORAL_CAPSULE | Freq: Three times a day (TID) | ORAL | Status: DC
  Administered 2017-08-05 – 2017-08-09 (×12): 300 mg via ORAL
  Filled 2017-08-05 (×12): qty 1

## 2017-08-05 MED ORDER — SODIUM CHLORIDE 0.9 % IV BOLUS (SEPSIS)
1000.0000 mL | Freq: Once | INTRAVENOUS | Status: AC
  Administered 2017-08-05: 1000 mL via INTRAVENOUS

## 2017-08-05 MED ORDER — AZITHROMYCIN 250 MG PO TABS
500.0000 mg | ORAL_TABLET | Freq: Once | ORAL | Status: AC
  Administered 2017-08-05: 500 mg via ORAL
  Filled 2017-08-05: qty 2

## 2017-08-05 MED ORDER — VITAMIN D (ERGOCALCIFEROL) 1.25 MG (50000 UNIT) PO CAPS
50000.0000 [IU] | ORAL_CAPSULE | ORAL | Status: DC
  Administered 2017-08-06: 50000 [IU] via ORAL
  Filled 2017-08-05: qty 1

## 2017-08-05 MED ORDER — SIMVASTATIN 20 MG PO TABS
20.0000 mg | ORAL_TABLET | Freq: Every evening | ORAL | Status: DC
  Administered 2017-08-06 – 2017-08-08 (×3): 20 mg via ORAL
  Filled 2017-08-05 (×3): qty 1

## 2017-08-05 MED ORDER — HYDROCHLOROTHIAZIDE 12.5 MG PO CAPS
12.5000 mg | ORAL_CAPSULE | Freq: Every day | ORAL | Status: DC
  Administered 2017-08-06: 12.5 mg via ORAL
  Filled 2017-08-05 (×2): qty 1

## 2017-08-05 MED ORDER — CARBAMAZEPINE 200 MG PO TABS
400.0000 mg | ORAL_TABLET | Freq: Three times a day (TID) | ORAL | Status: DC
  Administered 2017-08-05 – 2017-08-09 (×12): 400 mg via ORAL
  Filled 2017-08-05 (×15): qty 2

## 2017-08-05 MED ORDER — LORAZEPAM 2 MG/ML IJ SOLN: 1.0000 mg | Freq: Once | INTRAMUSCULAR | Status: DC | PRN

## 2017-08-05 MED ORDER — ENOXAPARIN SODIUM 40 MG/0.4ML ~~LOC~~ SOLN
40.0000 mg | SUBCUTANEOUS | Status: DC
  Administered 2017-08-05 – 2017-08-08 (×4): 40 mg via SUBCUTANEOUS
  Filled 2017-08-05 (×5): qty 0.4

## 2017-08-05 MED ORDER — GUAIFENESIN ER 600 MG PO TB12: 600.0000 mg | ORAL_TABLET | Freq: Two times a day (BID) | ORAL | Status: DC | PRN

## 2017-08-05 MED ORDER — ALBUTEROL SULFATE (2.5 MG/3ML) 0.083% IN NEBU: 2.5000 mg | INHALATION_SOLUTION | RESPIRATORY_TRACT | Status: DC | PRN

## 2017-08-05 MED ORDER — PANTOPRAZOLE SODIUM 40 MG PO TBEC
40.0000 mg | DELAYED_RELEASE_TABLET | Freq: Every day | ORAL | Status: DC
  Administered 2017-08-06 – 2017-08-09 (×4): 40 mg via ORAL
  Filled 2017-08-05 (×4): qty 1

## 2017-08-05 MED ORDER — AMLODIPINE BESYLATE 5 MG PO TABS
5.0000 mg | ORAL_TABLET | Freq: Every day | ORAL | Status: DC
  Administered 2017-08-06 – 2017-08-09 (×4): 5 mg via ORAL
  Filled 2017-08-05 (×4): qty 1

## 2017-08-05 MED ORDER — LOSARTAN POTASSIUM 50 MG PO TABS
100.0000 mg | ORAL_TABLET | Freq: Every day | ORAL | Status: DC
  Administered 2017-08-06 – 2017-08-09 (×4): 100 mg via ORAL
  Filled 2017-08-05 (×5): qty 2

## 2017-08-05 MED ORDER — SODIUM CHLORIDE 0.9 % IV SOLN
INTRAVENOUS | Status: DC
  Administered 2017-08-05 – 2017-08-09 (×7): via INTRAVENOUS

## 2017-08-05 MED ORDER — ASPIRIN EC 81 MG PO TBEC
81.0000 mg | DELAYED_RELEASE_TABLET | Freq: Every day | ORAL | Status: DC
  Administered 2017-08-06 – 2017-08-09 (×4): 81 mg via ORAL
  Filled 2017-08-05 (×4): qty 1

## 2017-08-05 NOTE — Progress Notes (Addendum)
Patient trasfered from 5C12 to 5W07 via bed; alert and oriented x 3, confused; no complaints of pain; IV in LAC running fluids @100cc /hr; skin dry with abrasion BLE. Orient patient to room and unit; gave patient care guide; tele monitor placed per MD order, also continue pulse ox; aspiration and seizure precaution initiated;  instructed how to use the call bell and  fall risk precautions. Will continue to monitor the patient.

## 2017-08-05 NOTE — ED Notes (Signed)
Patient family requesting "something to help him sleep at night". EDP aware

## 2017-08-05 NOTE — Progress Notes (Addendum)
CRITICAL VALUE STICKER  CRITICAL VALUE: Serum Osmolality- 245 and Sodium- 118  RECEIVER (on-site recipient of call): Marisa SprinklesSarah Ancelmo Hunt RN  DATE & TIME NOTIFIED: 08/05/17 19:48  MESSENGER (representative from lab):   MD NOTIFIED:   TIME OF NOTIFICATION: Provider Linton FlemingsX. Blount paged at 20:00  RESPONSE:  No new orders at this time.

## 2017-08-05 NOTE — ED Triage Notes (Signed)
Per [facility] patient coming from home with family. Family reports patient having syncopal episodes for the past 2 mornings. This morning lasting about 20 minutes. Patient denies any pain. Hx MR. Patient normally only oriented to self.

## 2017-08-05 NOTE — H&P (Signed)
History and Physical    Craig Yates ZOX:[id number] DOB: Jun 19, 1951 DOA: 08/05/2017   PCP: Fleet Contras, MD   Patient coming from:  Home    Chief Complaint: Seizures   HPI: Craig Yates is a 66 y.o. male with a history of mental challenge and seizures, on Tegretol, presenting after her caretaker with witnessed 1 episodes of seizures yesterday without recurrence.  He did not hit his head, or had any other trauma.  This lasted a few seconds, and according to her, they are somewhat more frequent than before.  A seizure before that was about 1 week ago.  She wanted to "bring him to get checked out before the storm came', referring to the upcoming snowstorm this weekend.  The patient did not verbalize any symptoms.  No apparent fevers, but he may have had a sick contact in 1 of his nephews who had a "cold ".  He had nonproductive cough over the last week.  There were no acute mental status changes.  No apparent urinary symptoms.  No nausea, or vomiting.  He has had over the last week significant decrease of oral fluid intake.  No diarrhea.  The caretaker is compliant with his medications.  ED Course:  BP 134/79   Pulse 63   Temp 98.6 F (37 C) (Oral)   Resp (!) 26   Ht 5\' 11"  (1.803 m)   Wt 79.4 kg (175 lb)   SpO2 100%   BMI 24.41 kg/m   Hemoglobin 10.9 Sodium 116 [place] 108 Alkaline phosphatase 156 Urinalysis negative Urine sodium and potassium are pending Carbamazepine level is pending Urine osmolality pending Troponin negative Chest x-ray noted right lower lobe pneumonia He was given 1 L of IV fluids, and was started on azithromycin He would be admitted for further management  Review of Systems:  As per HPI otherwise all other systems reviewed and are negative  Past Medical History:  Diagnosis Date  . Hypertension   . Mental retardation   . Seizures (HCC)     History reviewed. No pertinent surgical history.  Social History Social History   Socioeconomic History    . Marital status: Single    Spouse name: Not on file  . Number of children: Not on file  . Years of education: Not on file  . Highest education level: Not on file  Social Needs  . Financial resource strain: Not on file  . Food insecurity - worry: Not on file  . Food insecurity - inability: Not on file  . Transportation needs - medical: Not on file  . Transportation needs - non-medical: Not on file  Occupational History  . Not on file  Tobacco Use  . Smoking status: Never Smoker  . Smokeless tobacco: Never Used  Substance and Sexual Activity  . Alcohol use: No  . Drug use: No  . Sexual activity: Not on file  Other Topics Concern  . Not on file  Social History Narrative  . Not on file     No Known Allergies  No family history on file.    Prior to Admission medications   Medication Sig Start Date End Date Taking? Authorizing Provider  carbamazepine (TEGRETOL) 200 MG tablet Take 400 mg by mouth 3 (three) times daily.    [provider]  gabapentin (NEURONTIN) 300 MG capsule Take 300 mg by mouth 3 (three) times daily.    [provider]  lisinopril (PRINIVIL,ZESTRIL) 20 MG tablet  12/29/14   [provider]  lisinopril (PRINIVIL,ZESTRIL) 40 MG tablet  01/07/15   [provider]  pantoprazole (PROTONIX) 40 MG tablet  12/29/14   [provider]  simvastatin (ZOCOR) 20 MG tablet Take 20 mg by mouth every evening.    [provider]  Vitamin D, Ergocalciferol, (DRISDOL) 50000 UNITS CAPS capsule Take 50,000 Units by mouth every 7 (seven) days. Every Friday    [provider]    Physical Exam:  Vitals:   08/05/17 1300 08/05/17 1315 08/05/17 1345 08/05/17 1415  BP: 115/72 (!) 108/97 (!) 148/82 134/79  Pulse: 67 68 67 63  Resp: (!) 23 (!) 22 (!) 22 (!) 26  Temp:      TempSrc:      SpO2: 100% 100% 100% 100%  Weight:      Height:       Constitutional: NAD, calm, comfortable  Eyes: PERRL, lids and conjunctivae  normal ENMT: Mucous membranes are moist, without exudate or lesions.  Several missing teeth. Neck: normal, supple, no masses, no thyromegaly Respiratory decreased breath sounds on the right no wheezing, no crackles. Normal respiratory effort  Cardiovascular: Regular rate and rhythm, no murmur, rubs or gallops. No extremity edema. 2+ pedal pulses. No carotid bruits.  Abdomen: Soft, non tender, No hepatosplenomegaly. Bowel sounds positive.  Musculoskeletal: no clubbing / cyanosis. Moves all extremities Skin: no jaundice, No lesions.  Neurologic: Sensation intact  Strength equal in all extremities.  He has functional mental retardation Psychiatric:   Alert and oriented x 3. Normal mood.     Labs on Admission: I have personally reviewed following labs and imaging studies  CBC: Recent Labs  Lab 08/05/17 1259  WBC 8.2  NEUTROABS 6.4  HGB 10.9*  HCT 30.0*  MCV 84.0  PLT 224    Basic Metabolic Panel: Recent Labs  Lab 08/05/17 1259  NA 116*  K 3.5  CL 80*  CO2 21*  GLUCOSE 108*  BUN 8  CREATININE 1.05  CALCIUM 8.2*    GFR: Estimated Creatinine Clearance: 74.7 mL/min (by C-G formula based on SCr of 1.05 mg/dL).  Liver Function Tests: Recent Labs  Lab 08/05/17 1259  AST 37  ALT 41  ALKPHOS 156*  BILITOT 0.9  PROT 7.8  ALBUMIN 3.8   No results for input(s): LIPASE, AMYLASE in the last 168 hours. No results for input(s): AMMONIA in the last 168 hours.  Coagulation Profile: No results for input(s): INR, PROTIME in the last 168 hours.  Cardiac Enzymes: No results for input(s): CKTOTAL, CKMB, CKMBINDEX, TROPONINI in the last 168 hours.  BNP (last 3 results) No results for input(s): PROBNP in the last 8760 hours.  HbA1C: No results for input(s): HGBA1C in the last 72 hours.  CBG: No results for input(s): GLUCAP in the last 168 hours.  Lipid Profile: No results for input(s): CHOL, HDL, LDLCALC, TRIG, CHOLHDL, LDLDIRECT in the last 72 hours.  Thyroid Function  Tests: No results for input(s): TSH, T4TOTAL, FREET4, T3FREE, THYROIDAB in the last 72 hours.  Anemia Panel: No results for input(s): VITAMINB12, FOLATE, FERRITIN, TIBC, IRON, RETICCTPCT in the last 72 hours.  Urine analysis:    Component Value Date/Time   COLORURINE YELLOW 08/05/2017 1259   APPEARANCEUR CLEAR 08/05/2017 1259   LABSPEC 1.009 08/05/2017 1259   PHURINE 6.0 08/05/2017 1259   GLUCOSEU NEGATIVE 08/05/2017 1259   HGBUR SMALL (A) 08/05/2017 1259   BILIRUBINUR NEGATIVE 08/05/2017 1259   KETONESUR NEGATIVE 08/05/2017 1259   PROTEINUR NEGATIVE 08/05/2017 1259   UROBILINOGEN 0.2 05/15/2014 2010  NITRITE NEGATIVE 08/05/2017 1259   LEUKOCYTESUR NEGATIVE 08/05/2017 1259    Sepsis Labs: @LABRCNTIP (procalcitonin:4,lacticidven:4) )No results found for this or any previous visit (from the past 240 hour(s)).   Radiological Exams on Admission: Dg Chest 2 View  Result Date: 08/05/2017 CLINICAL DATA:  Congestion and syncope you for 2 days.  Nonsmoker. EXAM: CHEST  2 VIEW COMPARISON:  Chest radiograph October 30, 2016 FINDINGS: Increasingly elevated RIGHT hemidiaphragm with RIGHT lung base consolidation. Cardiomediastinal silhouette is normal. No pleural effusion. No pneumothorax. Soft tissue planes and included osseous structures are nonsuspicious. Colonic intra positioning. IMPRESSION: New RIGHT lower lobe atelectasis and pneumonia. Followup PA and lateral chest X-ray is recommended in 3-4 weeks following trial of antibiotic therapy to ensure resolution and exclude underlying malignancy. Electronically Signed   By: Awilda Metroourtnay  Bloomer M.D.   On: 08/05/2017 13:35    EKG: Independently reviewed.  Assessment/Plan Active Problems:   Accidental overdose by carbamazepine   Seizures (HCC)   Mental retardation   Hypertension   Acute hyponatremia   Hyperlipemia  RLL Pneumonia, likely due to aspiration in the setting of seizures (see below ).  CXR today suggests pneumonia. He was given 1 L  of IV fluids, and was started on azithromycin for 1 dose.  White count is 8.2, lactic acid 1.27. Admit to telemetry inpatient Pneumonia order set Oxygen   sputum cultures  IV antibiotics with Levaquin IV per pharmacy  Strep pneumo urine antigen, Legionella urine antigen Nebulizers as needed, with  Albuterol q 4h prn for wheezing Mucinex prn  IV fluids Repeat CBC in am  Severe hyponatremia, likely due to poor oral intake, versus medications.  He is symptomatic for seizures.  No diarrhea, palpitations, or acute mental status changes.  Sodium on presentation was 116.  Baseline is between 127 and 130.  Glucose is 108, therefore no significant correction is noted.  The patient received 1 L of IV normal saline Follow BMP every 6 hours Free water restriction to 1200 cc a day Fluids at 100 cc an hour at this time. Check chloride Check serum osmolality, urine osmolality, urine sodium, cortisol level, urine sodium, and TSH Avoid correcting more than 10 mEq over the period of 24 hours.    Seizures in the setting of severe hyponatremia and pneumonia as above: No history of drug abuse or ETOH.  Urinalysis negative for nitrites or leukocytes.  White count is normal.  No headache or other complaints to suggest intracranial process, CT of the head is negative.  Last episode was yesterday, without recurrence. Telemetry Seizure order set   Continue IV fluids as above Tegretol levels are pending Ativan as needed for seizures We will continue to monitor progress, if the patient continues to have seizure episodes, will consult neurology   Anemia of chronic disease Hemoglobin on admission . Baseline Hb  Hcult   Repeat CBC in am  No transfusion is indicated at this time Continue Iron supplements    Anemia of chronic disease Hemoglobin on admission 10.9, which is at baseline..  No bleeding issues are noted Repeat CBC in am  No transfusion is indicated at this time    Elevated alkaline  phosphatase, in the setting of dehydration, at this time 156.  No apparent tenderness on exam.  Urine is negative.  Bilirubin negative. Continue hydration Repeat CMETthe morning and continue to follow.  It is expected to normalize   Hypertension BP 134/79   Pulse 63   Controlled Continue home anti-hypertensive medications in a.m. as the  patient already took his dose this morning   Hyperlipidemia Continue home statins in a.m., as he already took his medicines this morning    GERD, no acute symptoms Continue PPI   Mental Challenge , without any acute issues.  Patient is functional. PT and OT         DVT prophylaxis: Lovenox Code Status:    Full Family Communication:  Discussed with patient Disposition Plan: Expect patient to be discharged to home after condition improves Consults called:    None Admission status: Telemetry inpatient   Marlowe KaysSara Analyce Tavares, PA-C [facility]   08/05/2017, 2:35 PM

## 2017-08-05 NOTE — ED Notes (Signed)
Patient transported to X-ray 

## 2017-08-05 NOTE — ED Provider Notes (Signed)
[facility] Note   CSN: [id number] Arrival date & time: 08/05/17  1154     History   Chief Complaint Chief Complaint  Patient presents with  . Loss of Consciousness    HPI Craig Yates is a 66 y.o. male.  HPI patient is a 66 year old male with mental retardation and seizures presenting with seizures.  Patient's caretaker says that he had a seizure yesterday.  She wanted to bring him into "get checked out before the storm came".  We are expecting a bunch of snow she wanted to make sure everything was okay before this no came.  Patient had no symptoms.  No fevers, no cough no symptoms no urinary symptoms.  Otherwise has been acting normally.  He does not usually have breakthrough seizures but occasionally does.  Past Medical History:  Diagnosis Date  . GERD (gastroesophageal reflux disease)   . Hypertension   . Mental retardation   . Seizures [facility](HCC)     Patient Active Problem List   Diagnosis Date Noted  . Acute hyponatremia 08/05/2017  . Hyperlipemia 08/05/2017  . Accidental overdose by carbamazepine 05/15/2014  . Seizures (HCC)   . Mental retardation   . Hypertension     History reviewed. No pertinent surgical history.     Home Medications    Prior to Admission medications   Medication Sig Start Date End Date Taking? Authorizing Provider  carbamazepine (TEGRETOL) 200 MG tablet Take 400 mg by mouth 3 (three) times daily.    [provider]  gabapentin (NEURONTIN) 300 MG capsule Take 300 mg by mouth 3 (three) times daily.    [provider]  lisinopril (PRINIVIL,ZESTRIL) 20 MG tablet  12/29/14   [provider]  lisinopril (PRINIVIL,ZESTRIL) 40 MG tablet  01/07/15   [provider]  pantoprazole (PROTONIX) 40 MG tablet  12/29/14   [provider]  simvastatin (ZOCOR) 20 MG tablet Take 20 mg by mouth every evening.    [provider]  Vitamin D, Ergocalciferol,  (DRISDOL) 50000 UNITS CAPS capsule Take 50,000 Units by mouth every 7 (seven) days. Every Friday    [provider]    Family History History reviewed. No pertinent family history.  Social History Social History   Tobacco Use  . Smoking status: Never Smoker  . Smokeless tobacco: Never Used  Substance Use Topics  . Alcohol use: No  . Drug use: No     Allergies   Patient has no known allergies.   Review of Systems Review of Systems  Constitutional: Negative for activity change.  Respiratory: Negative for shortness of breath.   Cardiovascular: Negative for chest pain.  Gastrointestinal: Negative for abdominal pain.     Physical Exam Updated Vital Signs BP 134/79   Pulse 63   Temp 98.6 F (37 C) (Oral)   Resp (!) 26   Ht 5\' 11"  (1.803 m)   Wt 79.4 kg (175 lb)   SpO2 100%   BMI 24.41 kg/m   Physical Exam  Constitutional: He appears well-nourished.  HENT:  Head: Normocephalic.  Eyes: Conjunctivae are normal. Right eye exhibits no discharge. Left eye exhibits no discharge.  Cardiovascular: Normal rate and regular rhythm.  No murmur heard. Pulmonary/Chest: Effort normal and breath sounds normal. No respiratory distress.  Abdominal: Soft. He exhibits no distension. There is no tenderness.  Neurological: He is alert. No cranial nerve deficit.  Skin: Skin is warm and dry. He is not diaphoretic.  Psychiatric: He has  a normal mood and affect. His behavior is normal.     ED Treatments / Results  Labs (all labs ordered are listed, but only abnormal results are displayed) Labs Reviewed  CBC WITH DIFFERENTIAL/PLATELET - Abnormal; Notable for the following components:      Result Value   RBC 3.57 (*)    Hemoglobin 10.9 (*)    HCT 30.0 (*)    MCHC 36.3 (*)    All other components within normal limits  COMPREHENSIVE METABOLIC PANEL - Abnormal; Notable for the following components:   Sodium 116 (*)    Chloride 80 (*)    CO2 21 (*)    Glucose, Bld 108 (*)     Calcium 8.2 (*)    Alkaline Phosphatase 156 (*)    All other components within normal limits  URINALYSIS, ROUTINE W REFLEX MICROSCOPIC - Abnormal; Notable for the following components:   Hgb urine dipstick SMALL (*)    Squamous Epithelial / LPF 0-5 (*)    All other components within normal limits  NA AND K (SODIUM & POTASSIUM), RAND UR  OSMOLALITY, URINE  CARBAMAZEPINE LEVEL, TOTAL  I-STAT TROPONIN, ED    EKG  EKG Interpretation None       Radiology Dg Chest 2 View  Result Date: 08/05/2017 CLINICAL DATA:  Congestion and syncope you for 2 days.  Nonsmoker. EXAM: CHEST  2 VIEW COMPARISON:  Chest radiograph October 30, 2016 FINDINGS: Increasingly elevated RIGHT hemidiaphragm with RIGHT lung base consolidation. Cardiomediastinal silhouette is normal. No pleural effusion. No pneumothorax. Soft tissue planes and included osseous structures are nonsuspicious. Colonic intra positioning. IMPRESSION: New RIGHT lower lobe atelectasis and pneumonia. Followup PA and lateral chest X-ray is recommended in 3-4 weeks following trial of antibiotic therapy to ensure resolution and exclude underlying malignancy. Electronically Signed   By: Awilda Metroourtnay  Bloomer M.D.   On: 08/05/2017 13:35    Procedures Procedures (including critical care time)  Medications Ordered in ED Medications  sodium chloride 0.9 % bolus 1,000 mL (0 mLs Intravenous Stopped 08/05/17 1403)  azithromycin (ZITHROMAX) tablet 500 mg (500 mg Oral Given 08/05/17 1402)     Initial Impression / Assessment and Plan / ED Course  I have reviewed the triage vital signs and the nursing notes.  Pertinent labs & imaging results that were available during my care of the patient were reviewed by me and considered in my medical decision making (see chart for details).     HPI patient is a 66 year old male with mental retardation and seizures presenting with seizures.  Patient's caretaker says that he had a seizure yesterday.  She wanted to bring  him into "get checked out before the storm came".  We are expecting a bunch of snow she wanted to make sure everything was okay before this no came.  Patient had no symptoms.  No fevers, no cough no symptoms no urinary symptoms.  Otherwise has been acting normally.  He does not usually have breakthrough seizures but occasionally does.  1:30 PM He has normal vital signs and physical exam.  Will run baseline labs.  Ultimately the patient will need follow-up with his outpatient neurologist.  With out any signs of infection.  2:27 PM Patient has pneumonia on x-ray as well as sodium of 116.  Will need admit for electrolyte repletion, and starting treatment for community acquired pneumonia.  Sent tegretol level. Patient had ho tegretol overdose in the past.  Final Clinical Impressions(s) / ED Diagnoses   Final diagnoses:  None  ED Discharge Orders    None       Abelino Derrick, MD 08/05/17 1447

## 2017-08-05 NOTE — ED Notes (Addendum)
Pt ambulated to the BR with one assist.  Sister at bedside

## 2017-08-05 NOTE — ED Notes (Signed)
Edp aware of sodium

## 2017-08-06 ENCOUNTER — Inpatient Hospital Stay (HOSPITAL_COMMUNITY): Payer: Medicare Other

## 2017-08-06 DIAGNOSIS — J181 Lobar pneumonia, unspecified organism: Secondary | ICD-10-CM

## 2017-08-06 DIAGNOSIS — R569 Unspecified convulsions: Secondary | ICD-10-CM

## 2017-08-06 DIAGNOSIS — E871 Hypo-osmolality and hyponatremia: Secondary | ICD-10-CM

## 2017-08-06 DIAGNOSIS — I10 Essential (primary) hypertension: Secondary | ICD-10-CM

## 2017-08-06 LAB — BASIC METABOLIC PANEL
ANION GAP: 11 (ref 5–15)
ANION GAP: 11 (ref 5–15)
Anion gap: 11 (ref 5–15)
BUN: 6 mg/dL (ref 6–20)
BUN: 6 mg/dL (ref 6–20)
BUN: 6 mg/dL (ref 6–20)
CALCIUM: 8 mg/dL — AB (ref 8.9–10.3)
CHLORIDE: 90 mmol/L — AB (ref 101–111)
CO2: 20 mmol/L — ABNORMAL LOW (ref 22–32)
CO2: 21 mmol/L — ABNORMAL LOW (ref 22–32)
CO2: 21 mmol/L — ABNORMAL LOW (ref 22–32)
CREATININE: 0.96 mg/dL (ref 0.61–1.24)
Calcium: 7.8 mg/dL — ABNORMAL LOW (ref 8.9–10.3)
Calcium: 8.4 mg/dL — ABNORMAL LOW (ref 8.9–10.3)
Chloride: 89 mmol/L — ABNORMAL LOW (ref 101–111)
Chloride: 92 mmol/L — ABNORMAL LOW (ref 101–111)
Creatinine, Ser: 0.85 mg/dL (ref 0.61–1.24)
Creatinine, Ser: 0.86 mg/dL (ref 0.61–1.24)
GFR calc non Af Amer: >60 mL/min (ref >60)
Glucose, Bld: 86 mg/dL (ref 65–99)
Glucose, Bld: 89 mg/dL (ref 65–99)
Glucose, Bld: 128 mg/dL — ABNORMAL HIGH (ref 65–99)
POTASSIUM: 4 mmol/L (ref 3.5–5.1)
POTASSIUM: 3.5 mmol/L (ref 3.5–5.1)
Potassium: 3.5 mmol/L (ref 3.5–5.1)
SODIUM: 122 mmol/L — AB (ref 135–145)
SODIUM: 124 mmol/L — AB (ref 135–145)
Sodium: 120 mmol/L — ABNORMAL LOW (ref 135–145)

## 2017-08-06 LAB — CBC
HEMATOCRIT: 25 % — AB (ref 39.0–52.0)
HEMOGLOBIN: 9 g/dL — AB (ref 13.0–17.0)
MCH: 29.9 pg (ref 26.0–34.0)
MCHC: 36 g/dL (ref 30.0–36.0)
MCV: 83.1 fL (ref 78.0–100.0)
Platelets: 216 10*3/uL (ref 150–400)
RBC: 3.01 MIL/uL — AB (ref 4.22–5.81)
RDW: 12.7 % (ref 11.5–15.5)
WBC: 5.4 10*3/uL (ref 4.0–10.5)

## 2017-08-06 LAB — LEGIONELLA PNEUMOPHILA SEROGP 1 UR AG: L. PNEUMOPHILA SEROGP 1 UR AG: NEGATIVE

## 2017-08-06 LAB — GLUCOSE, CAPILLARY: GLUCOSE-CAPILLARY: 83 mg/dL (ref 65–99)

## 2017-08-06 MED ORDER — POLYVINYL ALCOHOL 1.4 % OP SOLN
2.0000 [drp] | OPHTHALMIC | Status: DC | PRN
  Administered 2017-08-06 – 2017-08-08 (×3): 2 [drp] via OPHTHALMIC
  Filled 2017-08-06: qty 15

## 2017-08-06 MED ORDER — POLYETHYLENE GLYCOL 3350 17 G PO PACK
17.0000 g | PACK | Freq: Every day | ORAL | Status: DC
Start: 2017-08-06 — End: 2017-08-09
  Administered 2017-08-06 – 2017-08-09 (×4): 17 g via ORAL
  Filled 2017-08-06 (×4): qty 1

## 2017-08-06 NOTE — Progress Notes (Signed)
PROGRESS NOTE    Craig Yates  [id number] DOB: 09-Mar-1951 DOA: 08/05/2017 PCP: Fleet ContrasAvbuere, Edwin, MD    Brief Narrative:  66 year old male who presented with seizures. Patient is known to have seizure disorder, and cognitive dysfunction. Patient was noticed by his caregiver the development of recurrent seizures and persistent cough. On his initial physical examination blood pressure 115/72, heart rate 67, respiratory 23, oxygen saturation 100%. He had moist mucous membranes, his lungs had decreased breath sounds bilaterally, heart S1-S2 present rhythmic, no gallops, rubs or murmurs, the abdomen was soft nontender, lower extremities with no edema. Sodium 118, potassium 3.7, chloride 84, bicarbonate 21, glucose 97, BUN 7, creatinine 0.93, serum osmolality 245, white count 5.4, hemoglobin 9.0, hematocrit 25.0, platelets 216, carbamazepine 14.4, cortisol 15.8 TSH 1.13, 6 urinalysis negative for infection. Chest film with right rotation, positive infiltrate at the right lower lobe. ekg normal sinus rhythm, with noramal axis and normal intervals.   Patient was admitted to the hospital with the working diagnosis of community acquired pneumonia, likely aspiration in the setting of hyponatremia and uncontrolled seizures.   1. Pneumonia, right lower lobe, community acquired, suspected aspiration. Will continue antibiotic therapy with levofloxacin, continue oxymetry monitoring and supplemental 02 per Fried[place].   2. Hypovolemic hypoNatremia. Serum Na has corrected to 124, will continue hydration with isotonic saline. Calculated water deficit on admission was 7,8 L. This am serum bicarbonate at 21 with chloride 92. Will follow renal panel in am.   3. Uncontrolled seizures. Will continue neuro checks per unit protocol, no active seizures during his hospitalization. Will continue to treat lung infection and electrolyte disturbances. Continue tegretol.  4. HTN. Will continue blood pressure control with amlodipine,  hctz, and losartan.  5. Dyslipidemia. Continue statin therapy.    Assessment & Plan:   Active Problems:   Accidental overdose by carbamazepine   Seizures (HCC)   Mental retardation   Hypertension   Acute hyponatremia   Hyperlipemia   DVT prophylaxis: enoxaparin  Code Status: full Family Communication: I spoke with patient's care giver at the bedside and all questions were addressed.  Disposition Plan: Home   Consultants:     Procedures:     Antimicrobials:       Subjective: Patient feeling better, dyspnea has been improving, no cough , no nausea or vomiting, no reported seizure activity.   Objective: Vitals:   08/05/17 2045 08/06/17 0446 08/06/17 0931 08/06/17 1434  BP: (!) 143/81 123/80 (!) 151/77 114/75  Pulse: 77 68 70 71  Resp: 18 18  17   Temp: 98.6 F (37 C) 99 F (37.2 C)  97.8 F (36.6 C)  TempSrc: Oral Oral  Oral  SpO2: 100% 100%  100%  Weight:  84.1 kg (185 lb 6.5 oz)    Height:        Intake/Output Summary (Last 24 hours) at 08/06/2017 1458 Last data filed at 08/06/2017 1231 Gross per 24 hour  Intake 2233.33 ml  Output 2875 ml  Net -641.67 ml   Filed Weights   08/05/17 1202 08/06/17 0446  Weight: 79.4 kg (175 lb) 84.1 kg (185 lb 6.5 oz)    Examination:   General: Not in pain or dyspnea, deocnditioned Neurology: Awake and alert, non focal  E ENT: mild pallor, no icterus, oral mucosa moist Cardiovascular: No JVD. S1-S2 present, rhythmic, no gallops, rubs, or murmurs. No lower extremity edema. Pulmonary: decreased breath sounds bilaterally, adequate air movement, no wheezing, rhonchi. Positive rales at the left base. Gastrointestinal. Abdomen flat, no organomegaly,  non tender, no rebound or guarding. Positive reducible right inguinal hernia.  Skin. No rashes Musculoskeletal: no joint deformities     Data Reviewed: I have personally reviewed following labs and imaging studies  CBC: Recent Labs  Lab 08/05/17 1259 08/06/17 0643    WBC 8.2 5.4  NEUTROABS 6.4  --   HGB 10.9* 9.0*  HCT 30.0* 25.0*  MCV 84.0 83.1  PLT 224 216   Basic Metabolic Panel: Recent Labs  Lab 08/05/17 1259 08/05/17 1811 08/06/17 0044 08/06/17 0643 08/06/17 1329  NA 116* 118* 120* 122* 124*  K 3.5 3.7 4.0 3.5 3.5  CL 80* 84* 89* 90* 92*  CO2 21* 21* 20* 21* 21*  GLUCOSE 108* 97 86 89 128*  BUN 8 7 6 6 6   CREATININE 1.05 0.93 0.85 0.86 0.96  CALCIUM 8.2* 8.3* 8.0* 7.8* 8.4*   GFR: Estimated Creatinine Clearance: 81.7 mL/min (by C-G formula based on SCr of 0.96 mg/dL). Liver Function Tests: Recent Labs  Lab 08/05/17 1259  AST 37  ALT 41  ALKPHOS 156*  BILITOT 0.9  PROT 7.8  ALBUMIN 3.8   No results for input(s): LIPASE, AMYLASE in the last 168 hours. No results for input(s): AMMONIA in the last 168 hours. Coagulation Profile: No results for input(s): INR, PROTIME in the last 168 hours. Cardiac Enzymes: No results for input(s): CKTOTAL, CKMB, CKMBINDEX, TROPONINI in the last 168 hours. BNP (last 3 results) No results for input(s): PROBNP in the last 8760 hours. HbA1C: No results for input(s): HGBA1C in the last 72 hours. CBG: Recent Labs  Lab 08/05/17 2207 08/06/17 0756  GLUCAP 117* 83   Lipid Profile: No results for input(s): CHOL, HDL, LDLCALC, TRIG, CHOLHDL, LDLDIRECT in the last 72 hours. Thyroid Function Tests: Recent Labs    08/05/17 1811  TSH 1.213   Anemia Panel: No results for input(s): VITAMINB12, FOLATE, FERRITIN, TIBC, IRON, RETICCTPCT in the last 72 hours.    Radiology Studies: I have reviewed all of the imaging during this hospital visit personally     Scheduled Meds: . amLODipine  5 mg Oral Daily  . aspirin EC  81 mg Oral Daily  . carbamazepine  400 mg Oral TID  . enoxaparin (LOVENOX) injection  40 mg Subcutaneous Q24H  . gabapentin  300 mg Oral TID  . hydrochlorothiazide  12.5 mg Oral Daily  . losartan  100 mg Oral Daily  . pantoprazole  40 mg Oral Daily  . polyethylene glycol   17 g Oral Daily  . simvastatin  20 mg Oral QPM  . Vitamin D (Ergocalciferol)  50,000 Units Oral Q7 days   Continuous Infusions: . sodium chloride 100 mL/hr at 08/06/17 1007  . levofloxacin (LEVAQUIN) IV 750 mg (08/06/17 1405)     LOS: 1 day        Yousof Alderman Annett Gulaaniel Jeffery Bachmeier, MD [facility] Pager [phone]

## 2017-08-06 NOTE — Evaluation (Signed)
Physical Therapy Evaluation Patient Details Name: Craig Yates MRN: [id number] DOB: 08/22/1951 Today's Date: 08/06/2017   History of Present Illness  66 y.o. male with a history of mental challenge and seizures, on Tegretol, presenting after his caretaker witnessed 1 episodes of seizures on 12/5 without recurrence. Upon admission Pt found to have a right lower lobe pneumonia and severe hyponatremia with a sodium of 116.    Clinical Impression  Orders received for PT evaluation. Patient demonstrates deficits in functional mobility as indicated below. Will benefit from continued skilled PT to address deficits and maximize function. Will see as indicated and progress as tolerated.  Recommend HHPT upon acute discharge.    Follow Up Recommendations Home health PT;Supervision/Assistance - 24 hour    Equipment Recommendations  Rolling walker with 5" wheels    Recommendations for Other Services       Precautions / Restrictions Precautions Precautions: Fall Precaution Comments: seizure  Restrictions Weight Bearing Restrictions: No      Mobility  Bed Mobility Overal bed mobility: Needs Assistance Bed Mobility: Sit to Supine     Supine to sit: Min guard;HOB elevated Sit to supine: Min guard;HOB elevated   General bed mobility comments: Min guard for safety and line management  Transfers Overall transfer level: Needs assistance Equipment used: None Transfers: Sit to/from Stand Sit to Stand: Min assist         General transfer comment: Min assist for stability intially then able to maintain  Ambulation/Gait Ambulation/Gait assistance: Min assist Ambulation Distance (Feet): 140 Feet Assistive device: None Gait Pattern/deviations: Step-through pattern;Decreased stride length;Narrow base of support;Drifts right/left Gait velocity: decreased   General Gait Details: patient with multiple LOb and instability during gait at times self corrects other times requires min assist  for stability.   Stairs            Wheelchair Mobility    Modified Rankin (Stroke Patients Only)       Balance Overall balance assessment: Needs assistance Sitting-balance support: Feet supported Sitting balance-Leahy Scale: Good     Standing balance support: During functional activity Standing balance-Leahy Scale: Fair Standing balance comment: able to stand without UE support                             Pertinent Vitals/Pain Pain Assessment: No/denies pain    Home Living Family/patient expects to be discharged to:: Private residence Living Arrangements: Other relatives(sister ) Available Help at Discharge: Family Type of Home: House Home Access: Stairs to enter   Secretary/administratorntrance Stairs-Number of Steps: 2 Home Layout: One level   Additional Comments: home setup and PLOF obtained from Pt, unclear of accuracy of report due to Pt's baseline cognitive deficits     Prior Function           Comments: Pt reports independence with ADLs, initially reports using RW for mobility however later in session Pt reports he does not use RW, stating he needs one      Hand Dominance   Dominant Hand: Left    Extremity/Trunk Assessment   Upper Extremity Assessment Upper Extremity Assessment: Generalized weakness    Lower Extremity Assessment Lower Extremity Assessment: Generalized weakness       Communication   Communication: No difficulties  Cognition Arousal/Alertness: Awake/alert Behavior During Therapy: WFL for tasks assessed/performed Overall Cognitive Status: History of cognitive impairments - at baseline  General Comments: Pt with baseline cognitive impairments; requires short simple statement and multimodal cues during task completion and mobility       General Comments      Exercises     Assessment/Plan    PT Assessment Patient needs continued PT services  PT Problem List Decreased  strength;Decreased balance;Decreased activity tolerance;Decreased coordination;Decreased mobility;Decreased safety awareness       PT Treatment Interventions DME instruction;Gait training;Stair training;Therapeutic activities;Therapeutic exercise;Functional mobility training;Neuromuscular re-education;Patient/family education;Balance training;Cognitive remediation    PT Goals (Current goals can be found in the Care Plan section)  Acute Rehab PT Goals Patient Stated Goal: none stated  PT Goal Formulation: With patient/family Time For Goal Achievement: 08/20/17 Potential to Achieve Goals: Good    Frequency Min 3X/week   Barriers to discharge        Co-evaluation               AM-PAC PT "6 Clicks" Daily Activity  Outcome Measure Difficulty turning over in bed (including adjusting bedclothes, sheets and blankets)?: A Little Difficulty moving from lying on back to sitting on the side of the bed? : A Little Difficulty sitting down on and standing up from a chair with arms (e.g., wheelchair, bedside commode, etc,.)?: A Little Help needed moving to and from a bed to chair (including a wheelchair)?: A Little Help needed walking in hospital room?: A Little Help needed climbing 3-5 steps with a railing? : A Lot 6 Click Score: 17    End of Session Equipment Utilized During Treatment: Gait belt Activity Tolerance: Patient tolerated treatment well Patient left: in bed;with call bell/phone within reach;with bed alarm set;with family/visitor present Nurse Communication: Mobility status PT Visit Diagnosis: Unsteadiness on feet (R26.81)    Time: [id number] PT Time Calculation (min) (ACUTE ONLY): 18 min   Charges:   PT Evaluation $PT Eval Moderate Complexity: 1 Mod     PT G Codes:        Charlotte Crumbevon Quitman Norberto, PT DPT  Board Certified Neurologic Specialist [phone]   Fabio AsaDevon J Krystalynn Ridgeway 08/06/2017, 4:44 PM

## 2017-08-06 NOTE — Evaluation (Signed)
Occupational Therapy Evaluation Patient Details Name: Craig Yates MRN: [id number] DOB: March 11, 1951 Today's Date: 08/06/2017    History of Present Illness 66 y.o. male with a history of mental challenge and seizures, on Tegretol, presenting after his caretaker witnessed 1 episodes of seizures on 12/5 without recurrence. Upon admission Pt found to have a right lower lobe pneumonia and severe hyponatremia with a sodium of 116.     Clinical Impression   This 66 y/o M presents with the above. Unclear of accuracy of Pt's PLOF, however Pt reports he lives with his sister and was completing ADLs independently. Pt completed room level functional mobility at RW level with MinA this session, requires MaxA for toileting, Min-ModA for LB ADLs, presenting with generalized weakness and decreased dynamic balance. No family present at time of eval, however if 24 hr assist/supervision is available at home feel Pt can return home with additional HHOT services. If 24hr supervision not available may need to consider SNF rehab services. Will continue to follow acutely to maximize Pt's safety and independence with ADLs and mobility prior to discharge.     Follow Up Recommendations  Home health OT;Supervision/Assistance - 24 hour    Equipment Recommendations  3 in 1 bedside commode           Precautions / Restrictions Precautions Precautions: Fall Precaution Comments: seizure  Restrictions Weight Bearing Restrictions: No      Mobility Bed Mobility Overal bed mobility: Needs Assistance Bed Mobility: Supine to Sit     Supine to sit: Min guard;HOB elevated     General bed mobility comments: close guard for safety, no physical assist needed   Transfers Overall transfer level: Needs assistance Equipment used: Rolling walker (2 wheeled) Transfers: Sit to/from Stand Sit to Stand: Min assist         General transfer comment: assist to rise and steady at RW; stood x3 during session from EOB and BSC      Balance Overall balance assessment: Needs assistance Sitting-balance support: Feet supported Sitting balance-Leahy Scale: Good     Standing balance support: Bilateral upper extremity supported;During functional activity Standing balance-Leahy Scale: Fair                             ADL either performed or assessed with clinical judgement   ADL Overall ADL's : Needs assistance/impaired Eating/Feeding: Set up;Sitting   Grooming: Set up;Sitting   Upper Body Bathing: Min guard;Sitting   Lower Body Bathing: Minimal assistance;Sit to/from stand   Upper Body Dressing : Min guard;Sitting   Lower Body Dressing: Minimal assistance;Sit to/from stand Lower Body Dressing Details (indicate cue type and reason): Pt reaching to adjust socks while seated on BSC without assist  Toilet Transfer: Minimal assistance;Ambulation;BSC;RW Toilet Transfer Details (indicate cue type and reason): BSC over toilet  Toileting- Clothing Manipulation and Hygiene: Maximal assistance;Sit to/from stand Toileting - Clothing Manipulation Details (indicate cue type and reason): assist for gown management and peri-care after BM     Functional mobility during ADLs: Minimal assistance;Rolling walker General ADL Comments: Pt with increased difficulty sequencing mobility with RW, especially when walking through small spaces, suspect partly due to cognitive deficits as well as visual deficits; Pt completed room level mobility to toilet and then transferred to recliner      Vision Baseline Vision/History: No visual deficits Patient Visual Report: Other (comment)(Pt endoreses vision changes, but has difficulty describing details) Vision Assessment?: Vision impaired- to be further tested in functional  context Additional Comments: attempted vision assessment however difficult to complete due to Pt's cognitive impairments/difficulty following directions; Pt bumping into doorways/ledges with RW during mobility  and requiring assist to correct - to be further assessed                 Pertinent Vitals/Pain Pain Assessment: No/denies pain     Hand Dominance Left   Extremity/Trunk Assessment Upper Extremity Assessment Upper Extremity Assessment: Generalized weakness;Difficult to assess due to impaired cognition   Lower Extremity Assessment Lower Extremity Assessment: Defer to PT evaluation       Communication Communication Communication: No difficulties   Cognition Arousal/Alertness: Awake/alert Behavior During Therapy: WFL for tasks assessed/performed Overall Cognitive Status: History of cognitive impairments - at baseline                                 General Comments: Pt with baseline cognitive impairments; requires short simple statement and multimodal cues during task completion and mobility    General Comments                  Home Living Family/patient expects to be discharged to:: Private residence Living Arrangements: Other relatives(sister ) Available Help at Discharge: Family Type of Home: House Home Access: Stairs to enter     Home Layout: One level     Bathroom Shower/Tub: Chief Strategy Officer: Standard         Additional Comments: home setup and PLOF obtained from Pt, unclear of accuracy of report due to Pt's baseline cognitive deficits       Prior Functioning/Environment          Comments: Pt reports independence with ADLs, initially reports using RW for mobility however later in session Pt reports he does not use RW, stating he needs one         OT Problem List: Decreased strength;Impaired balance (sitting and/or standing);Decreased cognition;Decreased activity tolerance;Decreased knowledge of use of DME or AE      OT Treatment/Interventions: Self-care/ADL training;DME and/or AE instruction;Therapeutic activities;Balance training;Therapeutic exercise;Patient/family education    OT Goals(Current goals can be  found in the care plan section) Acute Rehab OT Goals Patient Stated Goal: none stated  OT Goal Formulation: With patient Time For Goal Achievement: 08/20/17 Potential to Achieve Goals: Good  OT Frequency: Min 2X/week                             AM-PAC PT "6 Clicks" Daily Activity     Outcome Measure Help from another person eating meals?: None Help from another person taking care of personal grooming?: A Little Help from another person toileting, which includes using toliet, bedpan, or urinal?: A Little Help from another person bathing (including washing, rinsing, drying)?: A Lot Help from another person to put on and taking off regular upper body clothing?: A Little Help from another person to put on and taking off regular lower body clothing?: A Lot 6 Click Score: 17   End of Session Equipment Utilized During Treatment: Gait belt;Rolling walker Nurse Communication: Mobility status;Other (comment)(okay to sit up in recliner given seizure precautions )  Activity Tolerance: Patient tolerated treatment well Patient left: in chair;with call bell/phone within reach;with chair alarm set  OT Visit Diagnosis: Other abnormalities of gait and mobility (R26.89);Muscle weakness (generalized) (M62.81);Other symptoms and signs involving cognitive function  Time: [id number] OT Time Calculation (min): 36 min Charges:  OT General Charges $OT Visit: 1 Visit OT Evaluation $OT Eval Low Complexity: 1 Low OT Treatments $Self Care/Home Management : 8-22 mins G-Codes:     Marcy SirenBreanna Cherylyn Sundby, OT Pager [phone] 08/06/2017  Craig Yates 08/06/2017, 3:34 PM

## 2017-08-07 DIAGNOSIS — E785 Hyperlipidemia, unspecified: Secondary | ICD-10-CM

## 2017-08-07 LAB — CBC WITH DIFFERENTIAL/PLATELET
BASOS ABS: 0 10*3/uL (ref 0.0–0.1)
Basophils Relative: 0 %
EOS PCT: 1 %
Eosinophils Absolute: 0 10*3/uL (ref 0.0–0.7)
HCT: 26.2 % — ABNORMAL LOW (ref 39.0–52.0)
HEMOGLOBIN: 9.5 g/dL — AB (ref 13.0–17.0)
LYMPHS ABS: 0.7 10*3/uL (ref 0.7–4.0)
LYMPHS PCT: 12 %
MCH: 30.4 pg (ref 26.0–34.0)
MCHC: 36.3 g/dL — ABNORMAL HIGH (ref 30.0–36.0)
MCV: 83.7 fL (ref 78.0–100.0)
Monocytes Absolute: 0.7 10*3/uL (ref 0.1–1.0)
Monocytes Relative: 12 %
NEUTROS ABS: 4.4 10*3/uL (ref 1.7–7.7)
NEUTROS PCT: 75 %
PLATELETS: 219 10*3/uL (ref 150–400)
RBC: 3.13 MIL/uL — AB (ref 4.22–5.81)
RDW: 12.9 % (ref 11.5–15.5)
WBC: 6 10*3/uL (ref 4.0–10.5)

## 2017-08-07 LAB — BASIC METABOLIC PANEL
ANION GAP: 9 (ref 5–15)
BUN: 6 mg/dL (ref 6–20)
CHLORIDE: 93 mmol/L — AB (ref 101–111)
CO2: 21 mmol/L — ABNORMAL LOW (ref 22–32)
Calcium: 8 mg/dL — ABNORMAL LOW (ref 8.9–10.3)
Creatinine, Ser: 0.91 mg/dL (ref 0.61–1.24)
GFR calc Af Amer: >60 mL/min (ref >60)
GLUCOSE: 102 mg/dL — AB (ref 65–99)
POTASSIUM: 3.3 mmol/L — AB (ref 3.5–5.1)
Sodium: 123 mmol/L — ABNORMAL LOW (ref 135–145)

## 2017-08-07 MED ORDER — HYDRALAZINE HCL 20 MG/ML IJ SOLN
5.0000 mg | INTRAMUSCULAR | Status: DC | PRN
  Administered 2017-08-07: 5 mg via INTRAVENOUS
  Filled 2017-08-07: qty 1

## 2017-08-07 NOTE — Progress Notes (Signed)
PROGRESS NOTE    Craig Yates  ZOX:[id number] DOB: 07-09-1951 DOA: 08/05/2017 PCP: Fleet ContrasAvbuere, Edwin, MD    Brief Narrative:  66 year old male who presented with seizures. Patient is known to have seizure disorder, and cognitive dysfunction. Patient was noticed by his caregiver the development of recurrent seizures and persistent cough. On his initial physical examination blood pressure 115/72, heart rate 67, respiratory 23, oxygen saturation 100%. He had moist mucous membranes, his lungs had decreased breath sounds bilaterally, heart S1-S2 present rhythmic, no gallops, rubs or murmurs, the abdomen was soft nontender, lower extremities with no edema. Sodium 118, potassium 3.7, chloride 84, bicarbonate 21, glucose 97, BUN 7, creatinine 0.93, serum osmolality 245, white count 5.4, hemoglobin 9.0, hematocrit 25.0, platelets 216, carbamazepine 14.4, cortisol 15.8 TSH 1.13, 6 urinalysis negative for infection. Chest film with right rotation, positive infiltrate at the right lower lobe. ekg normal sinus rhythm, with noramal axis and normal intervals.   Patient was admitted to the hospital with the working diagnosis of community acquired pneumonia, likely aspiration in the setting of hyponatremia and uncontrolled seizures.    Assessment & Plan:   Active Problems:   Accidental overdose by carbamazepine   Seizures (HCC)   Mental retardation   Hypertension   Acute hyponatremia   Hyperlipemia   1. Pneumonia, right lower lobe, community acquired, suspected aspiration. Tolerating well antibiotic therapy with IV levofloxacin, cultures have been no growth, will continue oxymetry monitoring. Oxygen saturation 100% on room air.    2. Hypovolemic hypoNatremia. Serum Na at 123, will liberate diet to regular, will continue isotonic saline at 75 ml per hour, will follow on renal panel in am. Due to increase risk of sezires with hyponatremia will target a serum Na greater than 130.   3. Uncontrolled  seizures. No further seizures, will continue neuro checks per unit protocol, will correct Na before discharge. Continue tegretol.  4. HTN. On amlodipine and losartan, with good blood pressure control. Will hold on hctz for now. Systolic blood pressure 114 to 139.   5. Dyslipidemia. Continue statin therapy.  6. Large left reducible inguinal hernia. Continue to be reducible, will need outpatient follow up.    DVT prophylaxis: enoxaparin  Code Status:  full Family Communication: I spoke with patient's family at the bedside and all questions were addressed.  Disposition Plan:    Consultants:     Procedures:     Antimicrobials:   Levofloxacin    Subjective: Patient feeling better, no nausea or vomiting, no chest pain, dyspnea continue to improve. No diarrhea or polydipsia.   Objective: Vitals:   08/06/17 0931 08/06/17 1434 08/06/17 2204 08/07/17 0644  BP: (!) 151/77 114/75 134/89 139/84  Pulse: 70 71 67 (!) 58  Resp:  17 18 18   Temp:  97.8 F (36.6 C) 98.4 F (36.9 C) (!) 97.4 F (36.3 C)  TempSrc:  Oral Oral Oral  SpO2:  100% 100% 100%  Weight:    84.1 kg (185 lb 6.5 oz)  Height:        Intake/Output Summary (Last 24 hours) at 08/07/2017 0818 Last data filed at 08/07/2017 0300 Gross per 24 hour  Intake 1920 ml  Output 1400 ml  Net 520 ml   Filed Weights   08/05/17 1202 08/06/17 0446 08/07/17 0644  Weight: 79.4 kg (175 lb) 84.1 kg (185 lb 6.5 oz) 84.1 kg (185 lb 6.5 oz)    Examination:   General: Not in pain or dyspnea Neurology: Awake and alert, non focal  E  ENT: no pallor, no icterus, oral mucosa moist Cardiovascular: No JVD. S1-S2 present, rhythmic, no gallops, rubs, or murmurs. No lower extremity edema. Pulmonary: vesicular breath sounds bilaterally, adequate air movement, no wheezing, rhonchi. Positive rales at the right base.  Gastrointestinal. Abdomen flat, no organomegaly, non tender, no rebound or guarding Skin. No rashes Musculoskeletal: no  joint deformities     Data Reviewed: I have personally reviewed following labs and imaging studies  CBC: Recent Labs  Lab 08/05/17 1259 08/06/17 0643 08/07/17 0227  WBC 8.2 5.4 6.0  NEUTROABS 6.4  --  4.4  HGB 10.9* 9.0* 9.5*  HCT 30.0* 25.0* 26.2*  MCV 84.0 83.1 83.7  PLT 224 216 219   Basic Metabolic Panel: Recent Labs  Lab 08/05/17 1811 08/06/17 0044 08/06/17 0643 08/06/17 1329 08/07/17 0227  NA 118* 120* 122* 124* 123*  K 3.7 4.0 3.5 3.5 3.3*  CL 84* 89* 90* 92* 93*  CO2 21* 20* 21* 21* 21*  GLUCOSE 97 86 89 128* 102*  BUN 7 6 6 6 6   CREATININE 0.93 0.85 0.86 0.96 0.91  CALCIUM 8.3* 8.0* 7.8* 8.4* 8.0*   GFR: Estimated Creatinine Clearance: 86.2 mL/min (by C-G formula based on SCr of 0.91 mg/dL). Liver Function Tests: Recent Labs  Lab 08/05/17 1259  AST 37  ALT 41  ALKPHOS 156*  BILITOT 0.9  PROT 7.8  ALBUMIN 3.8   No results for input(s): LIPASE, AMYLASE in the last 168 hours. No results for input(s): AMMONIA in the last 168 hours. Coagulation Profile: No results for input(s): INR, PROTIME in the last 168 hours. Cardiac Enzymes: No results for input(s): CKTOTAL, CKMB, CKMBINDEX, TROPONINI in the last 168 hours. BNP (last 3 results) No results for input(s): PROBNP in the last 8760 hours. HbA1C: No results for input(s): HGBA1C in the last 72 hours. CBG: Recent Labs  Lab 08/05/17 2207 08/06/17 0756  GLUCAP 117* 83   Lipid Profile: No results for input(s): CHOL, HDL, LDLCALC, TRIG, CHOLHDL, LDLDIRECT in the last 72 hours. Thyroid Function Tests: Recent Labs    08/05/17 1811  TSH 1.213   Anemia Panel: No results for input(s): VITAMINB12, FOLATE, FERRITIN, TIBC, IRON, RETICCTPCT in the last 72 hours.    Radiology Studies: I have reviewed all of the imaging during this hospital visit personally     Scheduled Meds: . amLODipine  5 mg Oral Daily  . aspirin EC  81 mg Oral Daily  . carbamazepine  400 mg Oral TID  . enoxaparin  (LOVENOX) injection  40 mg Subcutaneous Q24H  . gabapentin  300 mg Oral TID  . hydrochlorothiazide  12.5 mg Oral Daily  . losartan  100 mg Oral Daily  . pantoprazole  40 mg Oral Daily  . polyethylene glycol  17 g Oral Daily  . simvastatin  20 mg Oral QPM  . Vitamin D (Ergocalciferol)  50,000 Units Oral Q7 days   Continuous Infusions: . sodium chloride 75 mL/hr at 08/06/17 2218  . levofloxacin (LEVAQUIN) IV 750 mg (08/06/17 1405)     LOS: 2 days        Cece Milhouse Annett Gulaaniel Rob Mciver, MD [facility] Pager [phone]

## 2017-08-07 NOTE — Progress Notes (Signed)
Patient's BP 167/91. On call MD, AToniann Fail. Kakrakandy notified and hydralazine 5 mg Q4 PRN ordered for SBP > 160.  Will administer medication as ordered and monitor patient.  Will notify as needed.

## 2017-08-08 DIAGNOSIS — F79 Unspecified intellectual disabilities: Secondary | ICD-10-CM

## 2017-08-08 LAB — MAGNESIUM: MAGNESIUM: 1.7 mg/dL (ref 1.7–2.4)

## 2017-08-08 LAB — BASIC METABOLIC PANEL
ANION GAP: 9 (ref 5–15)
BUN: 8 mg/dL (ref 6–20)
CALCIUM: 8 mg/dL — AB (ref 8.9–10.3)
CO2: 20 mmol/L — ABNORMAL LOW (ref 22–32)
Chloride: 96 mmol/L — ABNORMAL LOW (ref 101–111)
Creatinine, Ser: 0.96 mg/dL (ref 0.61–1.24)
Glucose, Bld: 102 mg/dL — ABNORMAL HIGH (ref 65–99)
POTASSIUM: 3.2 mmol/L — AB (ref 3.5–5.1)
SODIUM: 125 mmol/L — AB (ref 135–145)

## 2017-08-08 LAB — CBC WITH DIFFERENTIAL/PLATELET
BASOS PCT: 1 %
Basophils Absolute: 0 10*3/uL (ref 0.0–0.1)
EOS ABS: 0.2 10*3/uL (ref 0.0–0.7)
EOS PCT: 3 %
HCT: 25.1 % — ABNORMAL LOW (ref 39.0–52.0)
HEMOGLOBIN: 9 g/dL — AB (ref 13.0–17.0)
Lymphocytes Relative: 29 %
Lymphs Abs: 1.4 10*3/uL (ref 0.7–4.0)
MCH: 30.3 pg (ref 26.0–34.0)
MCHC: 35.9 g/dL (ref 30.0–36.0)
MCV: 84.5 fL (ref 78.0–100.0)
Monocytes Absolute: 0.6 10*3/uL (ref 0.1–1.0)
Monocytes Relative: 13 %
NEUTROS PCT: 54 %
Neutro Abs: 2.7 10*3/uL (ref 1.7–7.7)
PLATELETS: 232 10*3/uL (ref 150–400)
RBC: 2.97 MIL/uL — AB (ref 4.22–5.81)
RDW: 13.1 % (ref 11.5–15.5)
WBC: 4.9 10*3/uL (ref 4.0–10.5)

## 2017-08-08 LAB — PHOSPHORUS: PHOSPHORUS: 3.5 mg/dL (ref 2.5–4.6)

## 2017-08-08 NOTE — Progress Notes (Signed)
PROGRESS NOTE    Craig Yates  ZOX:[id number] DOB: 10-24-1950 DOA: 08/05/2017 PCP: Fleet ContrasAvbuere, Edwin, MD   Brief Narrative: The patient is a 66 year old male who presented with seizures. Patient is known to have seizure disorder, and cognitive dysfunction. Patient was noticed by his caregiver the development of recurrent seizures and persistent cough.Chest film with right rotation, positive infiltrate at the right lower lobe. Patient was admitted to the hospital with the working diagnosis of community acquired pneumonia, likely aspiration in the setting of hyponatremia and uncontrolled seizures.   Assessment & Plan:   Active Problems:   Accidental overdose by carbamazepine   Seizures (HCC)   Mental retardation   Hypertension   Acute hyponatremia   Hyperlipemia  Pneumonia, right lower lobe, community acquired, suspected aspiration.  -Tolerating well antibiotic therapy with IV levofloxacin,  -Cultures have been no growth,  -WBC went from 6.0 -> 4.9 -C/w Supplemental O2 as needed; Oxygen saturation 100% on room air.  -C/w Guaifenesin 600 mg po BID -C/w Albuterol 2.5 mg Neb q4hprn Wheezing  Hypovolemic hypoNatremia.  -Serum Na at 125 now,  -C/w Regular Diet,  -Will continue isotonic saline at 75 ml per hour,  -Due to increase risk of sezires with hyponatremia will target a serum Na greater than 130.  -Repeat CMP in AM   Uncontrolled Seizures.  -No further seizures,  -will continue neuro checks per unit protocol, -will correct Na before discharge.  -Continue Carbamezapine 400 mg po TID and Gabapentin 300 mg po TID -Seizure Precautions  HTN -C/w Amlodipine and Losartan,  -Hold on hctz for now. -Continue to Monitor BP's -C/w Hydralazine 5 mg IV q4hprn SBP >160  Dyslipidemia.  -Continue statin therapy with Simvastatin  Large left reducible inguinal hernia.  -Continue to be reducible, will need outpatient follow up.   GERD -C/w Pantoprazole 40 mg po Daily   DVT  prophylaxis: Lovenox 40 mg sq q24h Code Status: FULL CODE Family Communication: Discussed with family present at bedside Disposition Plan: Remain Inpatient and anticipate D/C in next 24-48 hours  Consultants:  None   Procedures: None  Antimicrobials: Anti-infectives (From admission, onward)   Start     Dose/Rate Route Frequency Ordered Stop   08/06/17 1400  levofloxacin (LEVAQUIN) IVPB 750 mg     750 mg 100 mL/hr over 90 Minutes Intravenous Every 24 hours 08/05/17 1504 08/11/17 1359   08/05/17 1400  azithromycin (ZITHROMAX) tablet 500 mg     500 mg Oral  Once 08/05/17 1350 08/05/17 1402     Subjective: Seen and examined at bedside and states his abdomen was sore where his hernia was. Unable to cough up any sputum. No CP or SOB. Feels ok.   Objective: Vitals:   08/07/17 0644 08/07/17 2154 08/07/17 2253 08/08/17 0534  BP: 139/84 (!) 167/91 140/75 (!) 143/78  Pulse: (!) 58 68  69  Resp: 18 18  18   Temp: (!) 97.4 F (36.3 C) 98 F (36.7 C)  97.7 F (36.5 C)  TempSrc: Oral Axillary  Oral  SpO2: 100% 100%  100%  Weight: 84.1 kg (185 lb 6.5 oz)   84.1 kg (185 lb 6.5 oz)  Height:        Intake/Output Summary (Last 24 hours) at 08/08/2017 0904 Last data filed at 08/08/2017 0645 Gross per 24 hour  Intake 2561.25 ml  Output 700 ml  Net 1861.25 ml   Filed Weights   08/06/17 0446 08/07/17 0644 08/08/17 0534  Weight: 84.1 kg (185 lb 6.5 oz) 84.1 kg (  185 lb 6.5 oz) 84.1 kg (185 lb 6.5 oz)   Examination: Physical Exam:  Constitutional: WN/WD AAM in NAD and appears calm and comfortable Eyes: Lids and conjunctivae normal, sclerae anicteric  ENMT: External Ears, Nose appear normal. Grossly normal hearing. Mucous membranes are moist.  Neck: Appears normal, supple, no cervical masses, normal ROM, no appreciable thyromegaly; no JVD Respiratory: Diminished to auscultation bilaterally with mild rales on Right. No appreciable  rhonchi or crackles. Normal respiratory effort and patient  is not tachypenic. No accessory muscle use.  Cardiovascular: RRR, no murmurs / rubs / gallops. S1 and S2 auscultated. No extremity edema. .  Abdomen: Soft, slightly tender, non-distended. No masses palpated. No appreciable hepatosplenomegaly. Bowel sounds positive.  GU: Deferred. Musculoskeletal: No clubbing / cyanosis of digits/nails. No joint deformity upper and lower extremities Skin: No rashes, lesions, ulcers on a limited skin eval. No induration; Warm and dry.  Neurologic: CN 2-12 grossly intact with no focal deficits. Romberg sign and cerebellar reflexes not assessed.  Psychiatric:  Alert and awake. Normal mood and appropriate affect. Has some cognitive deficits.   Data Reviewed: I have personally reviewed following labs and imaging studies  CBC: Recent Labs  Lab 08/05/17 1259 08/06/17 0643 08/07/17 0227  WBC 8.2 5.4 6.0  NEUTROABS 6.4  --  4.4  HGB 10.9* 9.0* 9.5*  HCT 30.0* 25.0* 26.2*  MCV 84.0 83.1 83.7  PLT 224 216 219   Basic Metabolic Panel: Recent Labs  Lab 08/06/17 0044 08/06/17 0643 08/06/17 1329 08/07/17 0227 08/08/17 0223  NA 120* 122* 124* 123* 125*  K 4.0 3.5 3.5 3.3* 3.2*  CL 89* 90* 92* 93* 96*  CO2 20* 21* 21* 21* 20*  GLUCOSE 86 89 128* 102* 102*  BUN 6 6 6 6 8   CREATININE 0.85 0.86 0.96 0.91 0.96  CALCIUM 8.0* 7.8* 8.4* 8.0* 8.0*   GFR: Estimated Creatinine Clearance: 81.7 mL/min (by C-G formula based on SCr of 0.96 mg/dL). Liver Function Tests: Recent Labs  Lab 08/05/17 1259  AST 37  ALT 41  ALKPHOS 156*  BILITOT 0.9  PROT 7.8  ALBUMIN 3.8   No results for input(s): LIPASE, AMYLASE in the last 168 hours. No results for input(s): AMMONIA in the last 168 hours. Coagulation Profile: No results for input(s): INR, PROTIME in the last 168 hours. Cardiac Enzymes: No results for input(s): CKTOTAL, CKMB, CKMBINDEX, TROPONINI in the last 168 hours. BNP (last 3 results) No results for input(s): PROBNP in the last 8760 hours. HbA1C: No  results for input(s): HGBA1C in the last 72 hours. CBG: Recent Labs  Lab 08/05/17 2207 08/06/17 0756  GLUCAP 117* 83   Lipid Profile: No results for input(s): CHOL, HDL, LDLCALC, TRIG, CHOLHDL, LDLDIRECT in the last 72 hours. Thyroid Function Tests: Recent Labs    08/05/17 1811  TSH 1.213   Anemia Panel: No results for input(s): VITAMINB12, FOLATE, FERRITIN, TIBC, IRON, RETICCTPCT in the last 72 hours. Sepsis Labs: No results for input(s): PROCALCITON, LATICACIDVEN in the last 168 hours.  No results found for this or any previous visit (from the past 240 hour(s)).   Radiology Studies: No results found.  Scheduled Meds: . amLODipine  5 mg Oral Daily  . aspirin EC  81 mg Oral Daily  . carbamazepine  400 mg Oral TID  . enoxaparin (LOVENOX) injection  40 mg Subcutaneous Q24H  . gabapentin  300 mg Oral TID  . losartan  100 mg Oral Daily  . pantoprazole  40 mg Oral Daily  .  polyethylene glycol  17 g Oral Daily  . simvastatin  20 mg Oral QPM  . Vitamin D (Ergocalciferol)  50,000 Units Oral Q7 days   Continuous Infusions: . sodium chloride 75 mL/hr at 08/08/17 0401  . levofloxacin (LEVAQUIN) IV Stopped (08/07/17 1520)     LOS: 3 days   Merlene Laughter, DO [facility] Pager [phone]  If 7PM-7AM, please contact night-coverage [email or web address] Password [email or web address] 08/08/2017, 9:04 AM

## 2017-08-09 ENCOUNTER — Inpatient Hospital Stay (HOSPITAL_COMMUNITY): Payer: Medicare Other

## 2017-08-09 LAB — COMPREHENSIVE METABOLIC PANEL
ALK PHOS: 109 U/L (ref 38–126)
ALT: 30 U/L (ref 17–63)
AST: 25 U/L (ref 15–41)
Albumin: 2.8 g/dL — ABNORMAL LOW (ref 3.5–5.0)
Anion gap: 8 (ref 5–15)
BUN: 8 mg/dL (ref 6–20)
CALCIUM: 7.9 mg/dL — AB (ref 8.9–10.3)
CHLORIDE: 98 mmol/L — AB (ref 101–111)
CO2: 22 mmol/L (ref 22–32)
CREATININE: 0.94 mg/dL (ref 0.61–1.24)
GFR calc Af Amer: >60 mL/min (ref >60)
GFR calc non Af Amer: >60 mL/min (ref >60)
GLUCOSE: 88 mg/dL (ref 65–99)
Potassium: 3.1 mmol/L — ABNORMAL LOW (ref 3.5–5.1)
SODIUM: 128 mmol/L — AB (ref 135–145)
Total Bilirubin: <0.1 mg/dL — ABNORMAL LOW (ref 0.3–1.2)
Total Protein: 6.5 g/dL (ref 6.5–8.1)

## 2017-08-09 LAB — CBC WITH DIFFERENTIAL/PLATELET
BASOS ABS: 0 10*3/uL (ref 0.0–0.1)
Basophils Relative: 1 %
EOS ABS: 0.1 10*3/uL (ref 0.0–0.7)
EOS PCT: 3 %
HCT: 25.5 % — ABNORMAL LOW (ref 39.0–52.0)
HEMOGLOBIN: 9.1 g/dL — AB (ref 13.0–17.0)
LYMPHS ABS: 1.5 10*3/uL (ref 0.7–4.0)
LYMPHS PCT: 36 %
MCH: 30.3 pg (ref 26.0–34.0)
MCHC: 35.7 g/dL (ref 30.0–36.0)
MCV: 85 fL (ref 78.0–100.0)
Monocytes Absolute: 0.6 10*3/uL (ref 0.1–1.0)
Monocytes Relative: 14 %
NEUTROS PCT: 46 %
Neutro Abs: 1.9 10*3/uL (ref 1.7–7.7)
PLATELETS: 265 10*3/uL (ref 150–400)
RBC: 3 MIL/uL — AB (ref 4.22–5.81)
RDW: 13.4 % (ref 11.5–15.5)
WBC: 4.1 10*3/uL (ref 4.0–10.5)

## 2017-08-09 LAB — MAGNESIUM: Magnesium: 1.7 mg/dL (ref 1.7–2.4)

## 2017-08-09 LAB — PHOSPHORUS: Phosphorus: 4.2 mg/dL (ref 2.5–4.6)

## 2017-08-09 MED ORDER — LEVOFLOXACIN 750 MG PO TABS
750.0000 mg | ORAL_TABLET | Freq: Every day | ORAL | Status: DC
  Administered 2017-08-09: 750 mg via ORAL
  Filled 2017-08-09: qty 1

## 2017-08-09 MED ORDER — LEVOFLOXACIN 750 MG PO TABS: 750.0000 mg | ORAL_TABLET | Freq: Every day | ORAL | 0 refills | Status: DC

## 2017-08-09 MED ORDER — AMLODIPINE BESYLATE 10 MG PO TABS: 10.0000 mg | ORAL_TABLET | Freq: Every day | ORAL | 0 refills | Status: AC

## 2017-08-09 MED ORDER — POTASSIUM CHLORIDE CRYS ER 20 MEQ PO TBCR
40.0000 meq | EXTENDED_RELEASE_TABLET | Freq: Once | ORAL | Status: AC
  Administered 2017-08-09: 40 meq via ORAL
  Filled 2017-08-09: qty 2

## 2017-08-09 MED ORDER — AMLODIPINE BESYLATE 5 MG PO TABS: 5.0000 mg | ORAL_TABLET | Freq: Every day | ORAL | 0 refills | Status: DC

## 2017-08-09 MED ORDER — MAGNESIUM OXIDE 400 (241.3 MG) MG PO TABS
400.0000 mg | ORAL_TABLET | Freq: Once | ORAL | Status: AC
  Administered 2017-08-09: 400 mg via ORAL
  Filled 2017-08-09: qty 1

## 2017-08-09 MED ORDER — LEVOFLOXACIN 750 MG PO TABS
750.0000 mg | ORAL_TABLET | Freq: Every day | ORAL | 0 refills | Status: AC
Start: 2017-08-09 — End: 2017-08-12

## 2017-08-09 MED ORDER — AMLODIPINE BESYLATE 10 MG PO TABS: 10.0000 mg | ORAL_TABLET | Freq: Every day | ORAL | 0 refills | Status: DC

## 2017-08-09 NOTE — Progress Notes (Signed)
Occupational Therapy Treatment Patient Details Name: Craig Yates MRN: [id number] DOB: February 11, 1951 Today's Date: 08/09/2017    History of present illness 66 y.o. male with a history of mental challenge and seizures, on Tegretol, presenting after his caretaker witnessed 1 episodes of seizures on 12/5 without recurrence. Upon admission Pt found to have a right lower lobe pneumonia and severe hyponatremia with a sodium of 116.     OT comments  Pt progressing towards established OT goals. Pt performing toileting, hand hygiene, and LB dressing with Min Guard A and Min A for clothing management during toilet hygiene. Due to decreased cognition, pt required increased cues and time for completing ADLs. Provided education to pt and sister in preparation for dc later today. Continue to recommend dc with HHOT to optimize safety and independence with ADLs and functional mobility.   Follow Up Recommendations  Home health OT;Supervision/Assistance - 24 hour    Equipment Recommendations  3 in 1 bedside commode    Recommendations for Other Services      Precautions / Restrictions Precautions Precautions: Fall Precaution Comments: Seizure  Restrictions Weight Bearing Restrictions: No       Mobility Bed Mobility Overal bed mobility: Needs Assistance Bed Mobility: Sit to Supine     Supine to sit: Supervision     General bed mobility comments: supervision for safety and bed positioned to simulated home with HOB lowered  Transfers Overall transfer level: Needs assistance Equipment used: None Transfers: Sit to/from Stand Sit to Stand: Min guard         General transfer comment: Min Guard for safety    Balance Overall balance assessment: Needs assistance Sitting-balance support: Feet supported Sitting balance-Leahy Scale: Good     Standing balance support: During functional activity Standing balance-Leahy Scale: Fair Standing balance comment: able to stand without UE support                            ADL either performed or assessed with clinical judgement   ADL Overall ADL's : Needs assistance/impaired     Grooming: Set up;Supervision/safety;Cueing for sequencing;Standing;Wash/dry hands Grooming Details (indicate cue type and reason): hand hygiene at sink with visual and verbal cues for intiation and locating soap and paper towls/             Lower Body Dressing: Sit to/from stand;Min guard Lower Body Dressing Details (indicate cue type and reason): Min Guard for safety. Pt donning underwear and pants Toilet Transfer: RW;Min guard;Ambulation;Regular Teacher, adult educationToilet Toilet Transfer Details (indicate cue type and reason): Min Guard for safety Toileting- Clothing Manipulation and Hygiene: Minimal assistance;Sit to/from stand Toileting - Clothing Manipulation Details (indicate cue type and reason): assist for gown management   Tub/Shower Transfer Details (indicate cue type and reason): educated sister on safe tub transfer techniques for carry over to home Functional mobility during ADLs: Rolling walker;Minimal assistance;Cueing for safety General ADL Comments: Pt performing toileting and LB dressing with Min Guard- Min A. Pt requiring increased times and cues for completing tasks. Feel pt is near baseline function     Vision   Vision Assessment?: Vision impaired- to be further tested in functional context   Perception     Praxis      Cognition Arousal/Alertness: Awake/alert Behavior During Therapy: WFL for tasks assessed/performed Overall Cognitive Status: History of cognitive impairments - at baseline  General Comments: Pt with baseline cognitive impairments; requires short simple statement and multimodal cues during task completion and mobility         Exercises     Shoulder Instructions       General Comments      Pertinent Vitals/ Pain       Pain Assessment: No/denies pain  Home Living                                           Prior Functioning/Environment              Frequency  Min 2X/week        Progress Toward Goals  OT Goals(current goals can now be found in the care plan section)  Progress towards OT goals: Progressing toward goals  Acute Rehab OT Goals Patient Stated Goal: none stated  OT Goal Formulation: With patient Time For Goal Achievement: 08/20/17 Potential to Achieve Goals: Good ADL Goals Pt Will Perform Grooming: with modified independence;standing Pt Will Perform Upper Body Bathing: with modified independence;sitting Pt Will Perform Lower Body Bathing: with supervision;sit to/from stand Pt Will Perform Lower Body Dressing: with supervision;sit to/from stand Pt Will Transfer to Toilet: with supervision;ambulating;bedside commode(BSC over toilet) Pt Will Perform Toileting - Clothing Manipulation and hygiene: with supervision;sit to/from stand  Plan Discharge plan remains appropriate    Co-evaluation                 AM-PAC PT "6 Clicks" Daily Activity     Outcome Measure   Help from another person eating meals?: None Help from another person taking care of personal grooming?: A Little Help from another person toileting, which includes using toliet, bedpan, or urinal?: A Little Help from another person bathing (including washing, rinsing, drying)?: A Little Help from another person to put on and taking off regular upper body clothing?: A Little Help from another person to put on and taking off regular lower body clothing?: A Little 6 Click Score: 19    End of Session Equipment Utilized During Treatment: Gait belt;Rolling walker  OT Visit Diagnosis: Other abnormalities of gait and mobility (R26.89);Muscle weakness (generalized) (M62.81);Other symptoms and signs involving cognitive function   Activity Tolerance Patient tolerated treatment well   Patient Left in chair;with call bell/phone within reach;with  chair alarm set   Nurse Communication Mobility status;Other (comment)(okay to sit up in recliner given seizure precautions )        Time: 1212-1238 OT Time Calculation (min): 26 min  Charges: OT General Charges $OT Visit: 1 Visit OT Treatments $Self Care/Home Management : 23-37 mins  Craig Yates MSOT, OTR/L Acute Rehab Pager: [phone] Office: [phone]   Theodoro GristCharis M Xaden Kaufman 08/09/2017, 12:51 PM

## 2017-08-09 NOTE — Progress Notes (Signed)
PHARMACIST - PHYSICIAN COMMUNICATION DR:   Ella JubileeArrien CONCERNING: Antibiotic IV to Oral Route Change Policy  RECOMMENDATION: This patient is receiving Levaquin by the intravenous route.  Based on criteria approved by the Pharmacy and Therapeutics Committee, the antibiotic(s) is/are being converted to the equivalent oral dose form(s).   DESCRIPTION: These criteria include:  Patient being treated for a respiratory tract infection, urinary tract infection, cellulitis or clostridium difficile associated diarrhea if on metronidazole  The patient is not neutropenic and does not exhibit a GI malabsorption state  The patient is eating (either orally or via tube) and/or has been taking other orally administered medications for a least 24 hours  The patient is improving clinically and has a Tmax < 100.5   Loura BackJennifer Demarest, PharmD, BCPS Clinical Pharmacist 08/09/2017 9:26 AM   If you have questions about this conversion, please contact the Pharmacy Department  []   [phone]( [phone] )  [facility] []   [phone]( [phone] )  [facility] [x]   [phone]( [phone] )  [facility] []   [phone]( [phone] )  [facility] []   [phone]( [phone] )  [facility]

## 2017-08-09 NOTE — Progress Notes (Signed)
Physical Therapy Treatment Patient Details Name: Craig Yates MRN: [id number] DOB: [birth year] Today's Date: 08/09/2017    History of Present Illness 66 y.o. male with a history of mental challenge and seizures, on Tegretol, presenting after his caretaker witnessed 1 episodes of seizures on 12/5 without recurrence. Upon admission Pt found to have a right lower lobe pneumonia and severe hyponatremia with a sodium of 116.      PT Comments    Pt is agreeable to work with PT and is making good progress towards his goals. Pt is limited by instability with gait and unfamiliarity with using DME. Pt is currently min guard for transfers and minA for ambulation of 300 feet with RW. Pt need constant cuing for proximity to walker and slowing down his gait for safety. D/c plans remain appropriate. PT will continue to follow acutely.    Follow Up Recommendations  Home health PT;Supervision/Assistance - 24 hour     Equipment Recommendations  Rolling walker with 5" wheels    Recommendations for Other Services       Precautions / Restrictions Precautions Precautions: Fall Precaution Comments: seizure  Restrictions Weight Bearing Restrictions: No    Mobility  Bed Mobility Overal bed mobility: Needs Assistance Bed Mobility: Sit to Supine     Supine to sit: Supervision     General bed mobility comments: pt on EoB at entry  Transfers Overall transfer level: Needs assistance Equipment used: None Transfers: Sit to/from Stand Sit to Stand: Min guard         General transfer comment: min guard for safety  Ambulation/Gait Ambulation/Gait assistance: Min assist Ambulation Distance (Feet): 300 Feet Assistive device: None Gait Pattern/deviations: Step-through pattern;Decreased stride length;Narrow base of support;Drifts right/left Gait velocity: decreased Gait velocity interpretation: Below normal speed for age/gender General Gait Details: minA for minor instability during gait, no  LoB, maximal vc for slowing down and staying within the walker, pt wants to push it out in front of him        Balance Overall balance assessment: Needs assistance Sitting-balance support: Feet supported Sitting balance-Leahy Scale: Good     Standing balance support: During functional activity Standing balance-Leahy Scale: Fair Standing balance comment: able to stand without UE support                            Cognition Arousal/Alertness: Awake/alert Behavior During Therapy: WFL for tasks assessed/performed Overall Cognitive Status: History of cognitive impairments - at baseline                                 General Comments: Pt with baseline cognitive impairments; requires short simple statement and multimodal cues during task completion and mobility          General Comments General comments (skin integrity, edema, etc.): Educated sister on need for him to stay within walker and not push it out in front of him      Pertinent Vitals/Pain Pain Assessment: No/denies pain           PT Goals (current goals can now be found in the care plan section) Acute Rehab PT Goals Patient Stated Goal: none stated  PT Goal Formulation: With patient/family Time For Goal Achievement: 08/20/17 Potential to Achieve Goals: Good Progress towards PT goals: Progressing toward goals    Frequency    Min 3X/week      PT Plan Current plan  remains appropriate       AM-PAC PT "6 Clicks" Daily Activity  Outcome Measure  Difficulty turning over in bed (including adjusting bedclothes, sheets and blankets)?: A Little Difficulty moving from lying on back to sitting on the side of the bed? : A Little Difficulty sitting down on and standing up from a chair with arms (e.g., wheelchair, bedside commode, etc,.)?: A Little Help needed moving to and from a bed to chair (including a wheelchair)?: A Little Help needed walking in hospital room?: A Little Help needed  climbing 3-5 steps with a railing? : A Lot 6 Click Score: 17    End of Session Equipment Utilized During Treatment: Gait belt Activity Tolerance: Patient tolerated treatment well Patient left: in bed;with call bell/phone within reach;with bed alarm set;with family/visitor present Nurse Communication: Mobility status PT Visit Diagnosis: Unsteadiness on feet (R26.81)     Time: [id number] PT Time Calculation (min) (ACUTE ONLY): 12 min  Charges:  $Gait Training: 8-22 mins                    G Codes:       Craig Yates PT, DPT Acute Rehabilitation  [phone] Pager [phone]     Craig Yates 08/09/2017, 1:18 PM

## 2017-08-09 NOTE — Care Management Note (Addendum)
Case Management Note  Patient Details  Name: Craig Yates MRN: [id number] Date of Birth: [birth year]  Subjective/Objective:       Admitted with Seizures. Resides with sister.          Action/Plan: Transition to home today. Declined home health services. Owns : walker and BSC/3in 1. Family to provide transportation.  Expected Discharge Date:  08/09/17               Expected Discharge Plan:   home / self care Status of Service:   completed  If discussed at Long Length of Stay Meetings, dates discussed:    Additional Comments:  Epifanio LeschesCole, Modesty Rudy Hudson, RN 08/09/2017, 11:56 AM

## 2017-08-09 NOTE — Plan of Care (Signed)
Patient needs reinforcement with education d/t baseline mental status. Patients sister remained with patient throughout the shift.

## 2017-08-09 NOTE — Discharge Summary (Addendum)
Physician Discharge Summary  DEONTRAY HUNNICUTT ZOX:[id number] DOB: 04-Jul-1951 DOA: 08/05/2017  PCP: Fleet Contras, MD  Admit date: 08/05/2017 Discharge date: 08/09/2017  Admitted From: Home Disposition:  Home  Recommendations for Outpatient Follow-up:  1. Follow up with PCP in 1- week 2. Continue Levofloxacin for 3 more days 3. HCTZ has been discontinued to avoid hyponatremia.  4. Amlodipine has been increased to 10 mg for better blood pressure control.   Home Health: No  Equipment/Devices: No   Discharge Condition: Stable CODE STATUS: full  Diet recommendation: Heart healthy   Brief/Interim Summary: 66 year old male who presented with seizures. Patient is known to have seizure disorder, and cognitive dysfunction. Patient was noticed by his caregiver the development of recurrent seizures and persistent cough. On his initial physical examination blood pressure 115/72, heart rate 67, respiratory 23, oxygen saturation 100%. He had moist mucous membranes, his lungs had decreased breath sounds bilaterally, heart S1-S2 present rhythmic, no gallops, rubs or murmurs, the abdomen was soft nontender, lower extremities with no edema. Sodium 118, potassium 3.7, chloride 84, bicarbonate 21, glucose 97, BUN 7, creatinine 0.93, serum osmolality 245, white count 5.4, hemoglobin 9.0, hematocrit 25.0, platelets 216, carbamazepine 14.4, cortisol 15.8 TSH 1.13, 6, urinalysis negative for infection. Chest film with right rotation, positive infiltrate at the right lower lobe. ekg normal sinus rhythm, with noramal axis and normal intervals.   Patient was admitted to the hospital with the working diagnosis of community acquired pneumonia, likely aspiration in the setting of hyponatremia and uncontrolled seizures.   1. Community acquired pneumonia, right lower lobe, suspected aspiration in the setting of uncontrolled seizures. Patient was admitted to the medical ward, he was placed on a remote telemetry monitor, he  received IV antibiotic with levofloxacin. His symptoms have been improving with course of his hospital stay. Stable white cell count, and discharge 4.1, patient has remained afebrile.   2. Hypovolemic hyponatremia with hypokalemia and hypomagnesemia. Patient was placed on isotonic solutions intravenously with normal saline, urinary sodium was 27, with urinary osmolality 246 and serum osmolality of 245. Patient had a calculated water deficit of 7.8 L. His sodium has been improving, discharge sodium 128, patient is tolerating well by mouth intake, with no nausea or vomiting. Magnesium and potassium were repleted. Hydrochlorothiazide has been discontinued and will recommend follow-up electrolytes as an outpatient.   3. Uncontrolled seizures. Patient had neurochecks and seizure precautions, no further seizure activity noted. His electrolytes were corrected. Patient will continue carbamazepine.  4. Hypertension. Patient was continued on amlodipine and losartan, blood pressure remained controlled with systolic of 130 to 164 mmHg, hydrochlorothiazide has been discontinued to prevent further electrolyte disturbances. Amlodipine will be increased to 10 mg daily.  5. Dyslipidemia. Continue simvastatin.   Discharge Diagnoses:  Active Problems:   Accidental overdose by carbamazepine   Seizures (HCC)   Mental retardation   Hypertension   Acute hyponatremia   Hyperlipemia    Discharge Instructions   Allergies as of 08/09/2017   No Known Allergies     Medication List    STOP taking these medications   hydrochlorothiazide 12.5 MG capsule Commonly known as:  MICROZIDE   lisinopril 20 MG tablet Commonly known as:  PRINIVIL,ZESTRIL   lisinopril 40 MG tablet Commonly known as:  PRINIVIL,ZESTRIL     TAKE these medications   amLODipine 10 MG tablet Commonly known as:  NORVASC Take 1 tablet (10 mg total) by mouth daily. What changed:    medication strength  how much to take  aspirin EC  81 MG tablet Take 81 mg by mouth daily.   carbamazepine 200 MG tablet Commonly known as:  TEGRETOL Take 400 mg by mouth 3 (three) times daily.   cetirizine 10 MG tablet Commonly known as:  ZYRTEC Take 10 mg by mouth daily.   gabapentin 300 MG capsule Commonly known as:  NEURONTIN Take 300 mg by mouth 3 (three) times daily.   levofloxacin 750 MG tablet Commonly known as:  LEVAQUIN Take 1 tablet (750 mg total) by mouth daily at 2 PM for 3 days.   losartan 100 MG tablet Commonly known as:  COZAAR Take 100 mg by mouth daily.   pantoprazole 40 MG tablet Commonly known as:  PROTONIX   simvastatin 20 MG tablet Commonly known as:  ZOCOR Take 20 mg by mouth every evening.   Vitamin D (Ergocalciferol) 50000 units Caps capsule Commonly known as:  DRISDOL Take 50,000 Units by mouth every 7 (seven) days. Every Friday       No Known Allergies  Consultations:     Procedures/Studies: Dg Chest 2 View  Result Date: 08/06/2017 CLINICAL DATA:  Pneumonia. EXAM: CHEST  2 VIEW COMPARISON:  08/05/2017. FINDINGS: Mediastinum hilar structures normal. Persistent right base atelectasis/infiltrate without interim change. No pleural effusion or pneumothorax. Interposition of the colon right hemidiaphragm again noted. Distended loops of colon are noted. Abdominal exam suggested for further evaluation. IMPRESSION: 1. Persistent right base atelectasis and infiltrate. No change from prior exam. 2. Interposition of the colon under the right hemidiaphragm again noted . Distended loops of colon are noted. Abdominal series suggested for further evaluation. Electronically Signed   By: Maisie Fus  Register   On: 08/06/2017 09:07   Dg Chest 2 View  Result Date: 08/05/2017 CLINICAL DATA:  Congestion and syncope you for 2 days.  Nonsmoker. EXAM: CHEST  2 VIEW COMPARISON:  Chest radiograph October 30, 2016 FINDINGS: Increasingly elevated RIGHT hemidiaphragm with RIGHT lung base consolidation. Cardiomediastinal  silhouette is normal. No pleural effusion. No pneumothorax. Soft tissue planes and included osseous structures are nonsuspicious. Colonic intra positioning. IMPRESSION: New RIGHT lower lobe atelectasis and pneumonia. Followup PA and lateral chest X-ray is recommended in 3-4 weeks following trial of antibiotic therapy to ensure resolution and exclude underlying malignancy. Electronically Signed   By: Awilda Metro M.D.   On: 08/05/2017 13:35   Dg Chest Port 1 View  Result Date: 08/09/2017 CLINICAL DATA:  Shortness of breath EXAM: PORTABLE CHEST 1 VIEW COMPARISON:  08/06/2017 FINDINGS: There is no focal consolidation. There is mild right basilar atelectasis. There is no pleural effusion or pneumothorax. The heart and mediastinal contours are unremarkable. Persistent colonic distention partially visualized most prominent under the right diaphragm. The osseous structures are unremarkable. IMPRESSION: 1. No active cardiopulmonary disease. 2. Persisting colonic distension partially visualize most prominent under the right diaphragm. If there is clinical concern regarding abdominal pathology, recommend a two-view abdomen x-ray. Electronically Signed   By: Elige Ko   On: 08/09/2017 08:10       Subjective: Patient feeling well, no nausea or vomiting, no abdominal pain, dyspnea has improved, no cough.   Discharge Exam: Vitals:   08/09/17 0548 08/09/17 0913  BP: (!) 151/89 130/82  Pulse: 65   Resp: (!) 24   Temp: 98.7 F (37.1 C)   SpO2: 100%    Vitals:   08/08/17 1444 08/08/17 2119 08/09/17 0548 08/09/17 0913  BP: (!) 164/78 (!) 143/86 (!) 151/89 130/82  Pulse: 72 69 65   Resp: 18  16 (!) 24   Temp: 97.8 F (36.6 C) 99 F (37.2 C) 98.7 F (37.1 C)   TempSrc: Oral Oral Oral   SpO2: 100% 100% 100%   Weight:   83 kg (182 lb 15.7 oz)   Height:        General: Pt is alert, awake, not in acute distress E ENT. No pallor or icters Cardiovascular: RRR, S1/S2 +, no rubs, no  gallops Respiratory: CTA bilaterally, no wheezing, no rhonchi. Mild rales at the right base.  Abdominal: Soft, NT, ND, bowel sounds + Extremities: no edema, no cyanosis    The results of significant diagnostics from this hospitalization (including imaging, microbiology, ancillary and laboratory) are listed below for reference.     Microbiology: No results found for this or any previous visit (from the past 240 hour(s)).   Labs: BNP (last 3 results) No results for input(s): BNP in the last 8760 hours. Basic Metabolic Panel: Recent Labs  Lab 08/06/17 0643 08/06/17 1329 08/07/17 0227 08/08/17 0223 08/08/17 0907 08/09/17 0202  NA 122* 124* 123* 125*  --  128*  K 3.5 3.5 3.3* 3.2*  --  3.1*  CL 90* 92* 93* 96*  --  98*  CO2 21* 21* 21* 20*  --  22  GLUCOSE 89 128* 102* 102*  --  88  BUN 6 6 6 8   --  8  CREATININE 0.86 0.96 0.91 0.96  --  0.94  CALCIUM 7.8* 8.4* 8.0* 8.0*  --  7.9*  MG  --   --   --   --  1.7 1.7  PHOS  --   --   --   --  3.5 4.2   Liver Function Tests: Recent Labs  Lab 08/05/17 1259 08/09/17 0202  AST 37 25  ALT 41 30  ALKPHOS 156* 109  BILITOT 0.9 <0.1*  PROT 7.8 6.5  ALBUMIN 3.8 2.8*   No results for input(s): LIPASE, AMYLASE in the last 168 hours. No results for input(s): AMMONIA in the last 168 hours. CBC: Recent Labs  Lab 08/05/17 1259 08/06/17 0643 08/07/17 0227 08/08/17 0907 08/09/17 0202  WBC 8.2 5.4 6.0 4.9 4.1  NEUTROABS 6.4  --  4.4 2.7 1.9  HGB 10.9* 9.0* 9.5* 9.0* 9.1*  HCT 30.0* 25.0* 26.2* 25.1* 25.5*  MCV 84.0 83.1 83.7 84.5 85.0  PLT 224 216 219 232 265   Cardiac Enzymes: No results for input(s): CKTOTAL, CKMB, CKMBINDEX, TROPONINI in the last 168 hours. BNP: Invalid input(s): POCBNP CBG: Recent Labs  Lab 08/05/17 2207 08/06/17 0756  GLUCAP 117* 83   D-Dimer No results for input(s): DDIMER in the last 72 hours. Hgb A1c No results for input(s): HGBA1C in the last 72 hours. Lipid Profile No results for  input(s): CHOL, HDL, LDLCALC, TRIG, CHOLHDL, LDLDIRECT in the last 72 hours. Thyroid function studies No results for input(s): TSH, T4TOTAL, T3FREE, THYROIDAB in the last 72 hours.  Invalid input(s): FREET3 Anemia work up No results for input(s): VITAMINB12, FOLATE, FERRITIN, TIBC, IRON, RETICCTPCT in the last 72 hours. Urinalysis    Component Value Date/Time   COLORURINE YELLOW 08/05/2017 1259   APPEARANCEUR CLEAR 08/05/2017 1259   LABSPEC 1.009 08/05/2017 1259   PHURINE 6.0 08/05/2017 1259   GLUCOSEU NEGATIVE 08/05/2017 1259   HGBUR SMALL (A) 08/05/2017 1259   BILIRUBINUR NEGATIVE 08/05/2017 1259   KETONESUR NEGATIVE 08/05/2017 1259   PROTEINUR NEGATIVE 08/05/2017 1259   UROBILINOGEN 0.2 05/15/2014 2010   NITRITE NEGATIVE 08/05/2017 1259  LEUKOCYTESUR NEGATIVE 08/05/2017 1259   Sepsis Labs Invalid input(s): PROCALCITONIN,  WBC,  LACTICIDVEN Microbiology No results found for this or any previous visit (from the past 240 hour(s)).   Time coordinating discharge: 45 minutes  SIGNED:   Coralie KeensMauricio Daniel Corrin Sieling, MD  [facility] 08/09/2017, 11:39 AM Pager [phone]  If 7PM-7AM, please contact night-coverage [email or web address] Password [email or web address]

## 2017-09-28 ENCOUNTER — Ambulatory Visit: Payer: Medicare Other | Admitting: Podiatry

## 2017-09-29 ENCOUNTER — Encounter: Payer: Self-pay | Admitting: Podiatry

## 2017-09-29 ENCOUNTER — Ambulatory Visit (INDEPENDENT_AMBULATORY_CARE_PROVIDER_SITE_OTHER): Payer: Medicare HMO | Admitting: Podiatry

## 2017-09-29 DIAGNOSIS — M79674 Pain in right toe(s): Secondary | ICD-10-CM

## 2017-09-29 DIAGNOSIS — M79675 Pain in left toe(s): Secondary | ICD-10-CM

## 2017-09-29 DIAGNOSIS — B351 Tinea unguium: Secondary | ICD-10-CM | POA: Diagnosis not present

## 2017-09-29 NOTE — Progress Notes (Signed)
Subjective:   Patient ID: Craig Yates, male   DOB: 67 y.o.   MRN: [id number]   HPI Patient presents with elongated nails 1-5 both feet that are thick and he cannot cut   ROS      Objective:  Physical Exam  Thick yellow brittle nailbeds 1-5 both feet that are painful when palpated     Assessment:  Mycotic nail infection 1-5 both feet     Plan:  Debride painful nailbeds 1-5 both feet with no iatrogenic bleeding noted

## 2017-12-27 ENCOUNTER — Ambulatory Visit (INDEPENDENT_AMBULATORY_CARE_PROVIDER_SITE_OTHER): Payer: Medicare HMO | Admitting: Podiatry

## 2017-12-27 DIAGNOSIS — M79675 Pain in left toe(s): Secondary | ICD-10-CM | POA: Diagnosis not present

## 2017-12-27 DIAGNOSIS — B351 Tinea unguium: Secondary | ICD-10-CM | POA: Diagnosis not present

## 2017-12-27 DIAGNOSIS — M79674 Pain in right toe(s): Secondary | ICD-10-CM

## 2017-12-28 ENCOUNTER — Other Ambulatory Visit: Payer: Medicare HMO

## 2017-12-28 NOTE — Progress Notes (Signed)
   SUBJECTIVE Patient presents to office today complaining of elongated, thickened nails that cause pain while ambulating in shoes. He is unable to trim his own nails. Patient is here for further evaluation and treatment.  Past Medical History:  Diagnosis Date  . Accidental carbamazepine overdose 05/15/2014; 08/05/2017   Hattie Perch 05/16/2014; Hattie Perch 08/05/2017  . GERD (gastroesophageal reflux disease)   . Hypertension   . Hyponatremia 08/05/2017   severe/notes 08/05/2017  . Mental retardation   . Right lower lobe pneumonia (HCC) 08/05/2017   hx/notes 08/05/2017  . 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 Derm Skin is dry and supple bilateral. Negative open lesions or macerations. Remaining integument unremarkable. Nails are tender, long, thickened and dystrophic with subungual debris, consistent with onychomycosis, 1-5 bilateral. No signs of infection noted. Vasc  DP and PT pedal pulses palpable bilaterally. Temperature gradient within normal limits.  Neuro Epicritic and protective threshold sensation grossly intact bilaterally.  Musculoskeletal Exam No symptomatic pedal deformities noted bilateral. Muscular strength within normal limits.  ASSESSMENT 1. Onychodystrophic nails 1-5 bilateral with hyperkeratosis of nails.  2. Onychomycosis of nail due to dermatophyte bilateral 3. Pain in foot bilateral  PLAN OF CARE 1. Patient evaluated today.  2. Instructed to maintain good pedal hygiene and foot care.  3. Mechanical debridement of nails 1-5 bilaterally performed using a nail nipper. Filed with dremel without incident.  4. Return to clinic in 3 mos.    Felecia Shelling, DPM [facility]  Dr. Felecia Shelling, DPM    [address]                                        [place], [place] [address]                Office [phone]    Fax [phone]

## 2018-01-15 ENCOUNTER — Other Ambulatory Visit: Payer: Self-pay

## 2018-01-15 ENCOUNTER — Emergency Department (HOSPITAL_COMMUNITY)
Admission: EM | Admit: 2018-01-15 | Discharge: 2018-01-15 | Disposition: A | Discharge status: Home / Self Care | Payer: Medicare HMO | Attending: Emergency Medicine | Admitting: Emergency Medicine

## 2018-01-15 ENCOUNTER — Emergency Department (HOSPITAL_COMMUNITY): Payer: Medicare HMO

## 2018-01-15 ENCOUNTER — Encounter (HOSPITAL_COMMUNITY): Payer: Self-pay | Admitting: Emergency Medicine

## 2018-01-15 DIAGNOSIS — Z79899 Other long term (current) drug therapy: Secondary | ICD-10-CM | POA: Insufficient documentation

## 2018-01-15 DIAGNOSIS — R2242 Localized swelling, mass and lump, left lower limb: Secondary | ICD-10-CM | POA: Diagnosis present

## 2018-01-15 DIAGNOSIS — Z7982 Long term (current) use of aspirin: Secondary | ICD-10-CM | POA: Insufficient documentation

## 2018-01-15 DIAGNOSIS — N50811 Right testicular pain: Secondary | ICD-10-CM

## 2018-01-15 DIAGNOSIS — K4090 Unilateral inguinal hernia, without obstruction or gangrene, not specified as recurrent: Secondary | ICD-10-CM

## 2018-01-15 DIAGNOSIS — N5089 Other specified disorders of the male genital organs: Secondary | ICD-10-CM

## 2018-01-15 DIAGNOSIS — I10 Essential (primary) hypertension: Secondary | ICD-10-CM | POA: Diagnosis not present

## 2018-01-15 NOTE — ED Provider Notes (Signed)
[facility] EMERGENCY DEPARTMENT Provider Note   CSN: [id number] Arrival date & time: 01/15/18  1208     History   Chief Complaint Chief Complaint  Patient presents with  . Testicle Pain    HPI Craig Yates is a 67 y.o. male.  Patient with history of MR,seizures presents with swelling to the groin area. This was noticed earlier today when patient was in the shower. Sister thought that he had fallen however clarified and he did not fall but family member noticed new swelling. Unsure how long it has been there. No history of known hernia. No direct trauma to the area. No fevers or chills. Patient denies any pain. They have not noticed discomfort or vomiting. No fevers. Family members help take care of him.     Past Medical History:  Diagnosis Date  . Accidental carbamazepine overdose 05/15/2014; 08/05/2017   Hattie Perch 05/16/2014; Hattie Perch 08/05/2017  . GERD (gastroesophageal reflux disease)   . Hypertension   . Hyponatremia 08/05/2017   severe/notes 08/05/2017  . Mental retardation   . Right lower lobe pneumonia (HCC) 08/05/2017   hx/notes 08/05/2017  . 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 Acute hyponatremia 08/05/2017  . Hyperlipemia 08/05/2017  . Accidental overdose by carbamazepine 05/15/2014  . Seizures (HCC)   . Mental retardation   . Hypertension     Past Surgical History:  Procedure Laterality Date  . MULTIPLE EXTRACTIONS WITH ALVEOLOPLASTY  03/2013   Hattie Perch 01/13/2011  . TONSILLECTOMY          Home Medications    Prior to Admission medications   Medication Sig Start Date End Date Taking? Authorizing Provider  amLODipine (NORVASC) 10 MG tablet Take 1 tablet (10 mg total) by mouth daily. 08/09/17   Arrien, York Ram, MD  aspirin EC 81 MG tablet Take 81 mg by mouth daily.    [provider]  carbamazepine (TEGRETOL)  200 MG tablet Take 400 mg by mouth 3 (three) times daily.    [provider]  cetirizine (ZYRTEC) 10 MG tablet Take 10 mg by mouth daily.    [provider]  gabapentin (NEURONTIN) 300 MG capsule Take 300 mg by mouth 3 (three) times daily.    [provider]  losartan (COZAAR) 100 MG tablet Take 100 mg by mouth daily.    [provider]  pantoprazole (PROTONIX) 40 MG tablet  12/29/14   [provider]  simvastatin (ZOCOR) 20 MG tablet Take 20 mg by mouth every evening.    [provider]  Vitamin D, Ergocalciferol, (DRISDOL) 50000 UNITS CAPS capsule Take 50,000 Units by mouth every 7 (seven) days. Every Friday    [provider]    Family History History reviewed. No pertinent family history.  Social History Social History   Tobacco Use  . Smoking status: Never Smoker  . Smokeless tobacco: Never Used  Substance Use Topics  . Alcohol use: No  . Drug use: No     Allergies   Patient has no known allergies.   Review of Systems Review of Systems  Constitutional: Negative for fever.  Respiratory: Negative for shortness of breath.   Gastrointestinal: Negative for abdominal pain and vomiting.  Genitourinary: Negative for difficulty urinating.  Skin: Negative for rash.  Neurological: Negative for headaches.     Physical Exam Updated Vital  Signs BP 140/84 (BP Location: Right Arm)   Pulse 65   Temp 98.1 F (36.7 C) (Oral)   Resp 18   SpO2 100%   Physical Exam  Constitutional: He is oriented to person, place, and time. He appears well-developed and well-nourished.  HENT:  Head: Normocephalic and atraumatic.  Eyes: Conjunctivae are normal. Right eye exhibits no discharge. Left eye exhibits no discharge.  Neck: Normal range of motion. Neck supple. No tracheal deviation present.  Cardiovascular: Normal rate.  Pulmonary/Chest: Effort normal.  Abdominal: Soft. He exhibits no distension. There is no tenderness. There  is no guarding.  Genitourinary:  Genitourinary Comments: Patient has moderate swelling clinically inguinal herniaon the left. No discoloration to the skin no external sign of infection.No swelling or tenderness to the testicles. Hernia is able to be reduced with lying patient back.  Musculoskeletal: He exhibits no edema.  Neurological: He is alert and oriented to person, place, and time.  Skin: Skin is warm. No rash noted.  Psychiatric: He has a normal mood and affect.  Nursing note and vitals reviewed.    ED Treatments / Results  Labs (all labs ordered are listed, but only abnormal results are displayed) Labs Reviewed - No data to display  EKG None  Radiology US Scrotum W/doppler  Result Date: 01/15/2018 CLINICAL DATA:  Right testicular swelling beginning today. EXAM: SCROTAL ULTRASOUND DOPPLER ULTRASOUND OF THE TESTICLES TECHNIQUE: Complete ultrasound examination of the testicles, epididymis, and other scrotal structures was performed. Color and spectral Doppler ultrasound were also utilized to evaluate blood flow to the testicles. COMPARISON:  None. FINDINGS: Right testicle Measurements: 2.3 x 2.8 x 4.1 cm. No mass or microlithiasis visualized. Left testicle Measurements: 2.1 x 2.5 x 4.1 cm. No mass or microlithiasis visualized. Right epididymis:  Normal in size and appearance. Left epididymis: 5 mm cyst versus spermatocele over the epididymal head. Hydrocele: Tiny amount of bilateral scrotal fluid left worse than right. Varicocele:  None visualized. Pulsed Doppler interrogation of both testes demonstrates normal low resistance arterial and venous waveforms bilaterally. There is slight asymmetry with mild increased flow to the right testicle versus the left, although the degree of vascularity within the right testicle appears within normal. This may represent early orchitis. IMPRESSION: Normal vascularity to the testicles with slight increased vascularity to the right compared to the left.  Early right-sided orchitis is possible. No evidence of epididymitis or testicular abscess. 5 mm left epididymal cyst versus spermatocele. Tiny bilateral hydroceles. Electronically Signed   By: Elberta Fortis M.D.   On: 01/15/2018 14:22    Procedures Hernia reduction Date/Time: 01/15/2018 3:41 PM Performed by: Blane Ohara, MD Authorized by: Blane Ohara, MD  Consent: Verbal consent obtained. Risks and benefits: risks, benefits and alternatives were discussed Consent given by: patient and guardian Patient understanding: patient states understanding of the procedure being performed Patient consent: the patient's understanding of the procedure matches consent given Time out: Immediately prior to procedure a "time out" was called to verify the correct patient, procedure, equipment, support staff and site/side marked as required. Preparation: Patient was prepped and draped in the usual sterile fashion. Local anesthesia used: no  Anesthesia: Local anesthesia used: no  Sedation: Patient sedated: no  Patient tolerance: Patient tolerated the procedure well with no immediate complications Comments: Left inguinal hernia    (including critical care time)  Medications Ordered in ED Medications - No data to display   Initial Impression / Assessment and Plan / ED Course  I have reviewed the triage vital  signs and the nursing notes.  Pertinent labs & imaging results that were available during my care of the patient were reviewed by me and considered in my medical decision making (see chart for details).     patient presents with family who help take care of him and they noticed new swelling earlier today. Patient has not had signs of pain or infectious symptoms. Patient clinically has moderate to large left inguinal hernia without vomiting, tenderness or infection.Discussed very close follow-up with general surgery on Monday or Tuesday and strict reasons to return. Discussed lying flat to  help reduce and for comfort. In triage due to possible testicular complaints patient had ultrasound done with nonspecific swelling. On exam patient has no swelling or testicle tenderness. Patient has other more likely cause for symptoms/ signs. Results and differential diagnosis were discussed with the patient/parent/guardian. Xrays were independently reviewed by myself.  Close follow up outpatient was discussed, comfortable with the plan.   Medications - No data to display  Vitals:   01/15/18 1215 01/15/18 1534  BP: (!) 147/89 140/84  Pulse: 94 65  Resp: 20 18  Temp: 98.5 F (36.9 C) 98.1 F (36.7 C)  TempSrc: Oral Oral  SpO2: 99% 100%    Final diagnoses:  Left inguinal hernia    Final Clinical Impressions(s) / ED Diagnoses   Final diagnoses:  Left inguinal hernia    ED Discharge Orders    None       Blane Ohara, MD 01/15/18 [id number]

## 2018-01-15 NOTE — Discharge Instructions (Addendum)
Follow-up closely with general surgery on Monday or Tuesday. If you have persistent pain, vomiting, discoloration to the skin, blood in the stools, fevers or new concerns come back to the ER immediately.\ Lie flat in relax abdominal muscles and he should be able to push hernia back into your abdomen.

## 2018-01-15 NOTE — ED Triage Notes (Signed)
Patient presents to ED for assessment of right testicular swelling.  Patient is MR and is a poor historian.  Sister states she noticed it today when he was getting out of the shower because "I heard a thud and thought he fell".  Patient denies falling and was standing upon his sisters arrival.  Sister noticed right testicular swelling with a hx of the same in the past.  No leg edema noted in triage.  Patient denies SOB.  Patient c/o minor pain.

## 2018-01-15 NOTE — ED Notes (Signed)
Patient able to ambulate independently  

## 2018-01-26 ENCOUNTER — Ambulatory Visit: Payer: Self-pay | Admitting: General Surgery

## 2018-02-22 NOTE — Pre-Procedure Instructions (Signed)
Craig Yates  02/23/2018      [id number] - [place], [place] - [address] AT [facility] [address] Craig Yates [place] [address] Phone: [phone] Fax: [phone]  [facility] [address] - [place], [place] - [address] AT [facility] [address] [place] [address] Phone: [phone] Fax: [phone]    Your procedure is scheduled on Wed., March 02, 2018 from 10:30AM-12:00PM  Report to [facility] Admitting Entrance "A" at 8:30AM  Call this number if you have problems the morning of surgery:  [phone]   Remember:  Do not eat after midnight.  You may drink Clear Liquids until 3 hours 7:30AM prior to surgery .  Clear liquids allowed are: Water, Juice (non-citric and without pulp), Carbonated beverages, Clear Tea, Black Coffee only, Plain Jell-O only, Gatorade and Plain Popsicles only   Please complete your PRE-SURGERY ENSURE that was given to by 7:30AM the morning of surgery.  Please, if able, drink it in one setting. DO NOT SIP.   Take these medicines the morning of surgery with A SIP OF WATER By 7:30AM: AmLODipine (NORVASC), Carbamazepine (TEGRETOL), Cetirizine (ZYRTEC), Gabapentin (NEURONTIN), and Pantoprazole (PROTONIX)  Follow your doctors instructions regarding your Aspirin.  If no instructions were given by your doctor, then you will need to call the prescribing office office to get instructions.    As of today, stop taking all Other Aspirin Products, Vitamins, Fish oils, and Herbal medications. Also stop all NSAIDS i.e. Advil, Ibuprofen, Motrin, Aleve, Anaprox, Naproxen, BC, Goody Powders, and all Supplements.    Do not wear jewelry.  Do not wear lotions, powders, colognes, or deodorant.  Do not shave 48 hours prior to surgery.  Men may shave face.  Do not bring valuables to the hospital.  [facility] is not responsible for any belongings or valuables.  Contacts, dentures  or bridgework may not be worn into surgery.  Leave your suitcase in the car.  After surgery it may be brought to your room.  For patients admitted to the hospital, discharge time will be determined by your treatment team.  Patients discharged the day of surgery will not be allowed to drive home.   Special instructions:   Craig Yates- Preparing For Surgery  Before surgery, you can play an important role. Because skin is not sterile, your skin needs to be as free of germs as possible. You can reduce the number of germs on your skin by washing with CHG (chlorahexidine gluconate) Soap before surgery.  CHG is an antiseptic cleaner which kills germs and bonds with the skin to continue killing germs even after washing.    Oral Hygiene is also important to reduce your risk of infection.  Remember - BRUSH YOUR TEETH THE MORNING OF SURGERY WITH YOUR REGULAR TOOTHPASTE  Please do not use if you have an allergy to CHG or antibacterial soaps. If your skin becomes reddened/irritated stop using the CHG.  Do not shave (including legs and underarms) for at least 48 hours prior to first CHG shower. It is OK to shave your face.  Please follow these instructions carefully.   1. Shower the NIGHT BEFORE SURGERY and the MORNING OF SURGERY with CHG.   2. If you chose to wash your hair, wash your hair first as usual with your normal shampoo.  3. After you shampoo, rinse your hair and body thoroughly to remove the  shampoo.  4. Use CHG as you would any other liquid soap. You can apply CHG directly to the skin and wash gently with a scrungie or a clean washcloth.   5. Apply the CHG Soap to your body ONLY FROM THE NECK DOWN.  Do not use on open wounds or open sores. Avoid contact with your eyes, ears, mouth and genitals (private parts). Wash Face and genitals (private parts)  with your normal soap.  6. Wash thoroughly, paying special attention to the area where your surgery will be performed.  7. Thoroughly rinse  your body with warm water from the neck down.  8. DO NOT shower/wash with your normal soap after using and rinsing off the CHG Soap.  9. Pat yourself dry with a CLEAN TOWEL.  10. Wear CLEAN PAJAMAS to bed the night before surgery, wear comfortable clothes the morning of surgery  11. Place CLEAN SHEETS on your bed the night of your first shower and DO NOT SLEEP WITH PETS.  Day of Surgery:  Do not apply any deodorants/lotions.  Please wear clean clothes to the hospital/surgery center.   Remember to brush your teeth WITH YOUR REGULAR TOOTHPASTE.  Please read over the following fact sheets that you were given. Pain Booklet, Coughing and Deep Breathing and Surgical Site Infection Prevention

## 2018-02-23 ENCOUNTER — Encounter (HOSPITAL_COMMUNITY): Payer: Self-pay

## 2018-02-23 ENCOUNTER — Other Ambulatory Visit: Payer: Self-pay

## 2018-02-23 ENCOUNTER — Encounter (HOSPITAL_COMMUNITY)
Admission: RE | Admit: 2018-02-23 | Discharge: 2018-02-23 | Disposition: A | Discharge status: Home / Self Care | Payer: Medicare HMO | Source: Ambulatory Visit | Attending: General Surgery | Admitting: General Surgery

## 2018-02-23 ENCOUNTER — Encounter (HOSPITAL_COMMUNITY): Payer: Self-pay | Admitting: Vascular Surgery

## 2018-02-23 DIAGNOSIS — Z01812 Encounter for preprocedural laboratory examination: Secondary | ICD-10-CM | POA: Diagnosis not present

## 2018-02-23 HISTORY — DX: Unilateral inguinal hernia, without obstruction or gangrene, not specified as recurrent: K40.90

## 2018-02-23 LAB — CBC
HCT: 32.7 % — ABNORMAL LOW (ref 39.0–52.0)
Hemoglobin: 11.4 g/dL — ABNORMAL LOW (ref 13.0–17.0)
MCH: 29.5 pg (ref 26.0–34.0)
MCHC: 34.9 g/dL (ref 30.0–36.0)
MCV: 84.7 fL (ref 78.0–100.0)
PLATELETS: 307 10*3/uL (ref 150–400)
RBC: 3.86 MIL/uL — ABNORMAL LOW (ref 4.22–5.81)
RDW: 13.5 % (ref 11.5–15.5)
WBC: 6.1 10*3/uL (ref 4.0–10.5)

## 2018-02-23 LAB — BASIC METABOLIC PANEL
Anion gap: 9 (ref 5–15)
BUN: 12 mg/dL (ref 8–23)
CALCIUM: 8.6 mg/dL — AB (ref 8.9–10.3)
CHLORIDE: 87 mmol/L — AB (ref 98–111)
CO2: 23 mmol/L (ref 22–32)
CREATININE: 0.89 mg/dL (ref 0.61–1.24)
GFR calc Af Amer: >60 mL/min (ref >60)
Glucose, Bld: 95 mg/dL (ref 70–99)
POTASSIUM: 3.9 mmol/L (ref 3.5–5.1)
SODIUM: 119 mmol/L — AB (ref 135–145)

## 2018-02-23 NOTE — Progress Notes (Signed)
PCP - [facility]  Cardiologist - Denies  Chest x-ray - 08/09/17 1-view (E)  EKG - 08/05/17 (E)  Stress Test - Denies  ECHO - Denies  Cardiac Cath - Denies  Sleep Study - Denies CPAP - None  LABS- 02/23/18: CBC, BMP  ASA- LD- 6/26, per Dr. Sheliah HatchKinsinger  Pt has a history of MR, so his sister Ola and her husband are his primary caretakers. One of them will be here dos.   Anesthesia- No  Pt denies having chest pain, sob, or fever at this time. All instructions explained to the pt, with a verbal understanding of the material. Pt agrees to go over the instructions while at home for a better understanding. The opportunity to ask questions was provided.

## 2018-02-23 NOTE — Progress Notes (Signed)
Dr. Sheliah HatchKinsinger made aware via the main lab of the abnormal Na+ 119 at 1215 today. Will send to anesthesia, as well for follow-up.

## 2018-02-23 NOTE — Progress Notes (Signed)
Anesthesia Chart Review:  Case:  [id number] Date/Time:  03/02/18 1015   Procedures:      OPEN LEFT INGUINAL HERNIA REPAIR WITH MESH (Left )     INSERTION OF MESH (Left )   Anesthesia type:  General   Pre-op diagnosis:  LEFT INGUINAL HERNIA   Location:  [facility] OR ROOM 08 / [facility] OR   Surgeon:  Kinsinger, De BlanchLuke Aaron, MD      DISCUSSION: Patient is a 67 year old male scheduled for the above procedure. History includes never smoker, HTN, mental retardation, GERD, seizures (accidental carbamazepine OD '15), hyponatremia. His sister is his primary caregiver.  - Admission 08/05/17-08/09/17 for CAP, suspected aspiration in the setting of seizures. Also had hyponatremia (116 on admission), hypokalemia, and hypomagnesemia thought likely from poor oral intake/hypovolemia.   Last ASA dose 02/23/18.   Patient with critical Na result of 119. Labs already called to surgeon. I spoke with [facility] nurse Armen and asked that his staff call me with an update. Hyponatremia would need to be improved to proceed with elective surgery.  Chart will be left for follow-up.  VS: BP (!) 158/74   Pulse 77   Temp (!) 36.3 C   Resp 20   Ht 5\' 9"  (1.753 m)   Wt 195 lb 8 oz (88.7 kg)   SpO2 100%   BMI 28.87 kg/m   PROVIDERS: Fleet ContrasAvbuere, Edwin, MD is PCP    LABS: H/H 11.4/32.7. Cr 0.89. Glucose 95. Na 119. Cl 87. By review of labs, may have some mild chronic hyponatremia. As above results, called to surgeon and will need to be addressed prior to surgery. (all labs ordered are listed, but only abnormal results are displayed)  Labs Reviewed  BASIC METABOLIC PANEL - Abnormal; Notable for the following components:      Result Value   Sodium 119 (*)    Chloride 87 (*)    Calcium 8.6 (*)    All other components within normal limits  CBC - Abnormal; Notable for the following components:   RBC 3.86 (*)    Hemoglobin 11.4 (*)    HCT 32.7 (*)    All other components within normal limits     IMAGES: 1V CXR  08/09/17: IMPRESSION: 1. No active cardiopulmonary disease. 2. Persisting colonic distension partially visualize most prominent under the right diaphragm. If there is clinical concern regarding abdominal pathology, recommend a two-view abdomen x-ray.  EKG: 08/06/17: Normal sinus rhythm.  Minimal voltage criteria for LVH, may be normal variant.   CV: N/A  Past Medical History:  Diagnosis Date  . Accidental carbamazepine overdose 05/15/2014; 08/05/2017   Hattie Perch/notes 05/16/2014; Hattie Perch/notes 08/05/2017  . GERD (gastroesophageal reflux disease)   . Hypertension   . Hyponatremia 08/05/2017   severe/notes 08/05/2017  . Inguinal hernia    Left  . Mental retardation   . Right lower lobe pneumonia (HCC) 08/05/2017   hx/notes 08/05/2017  . 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 APPENDECTOMY    . MULTIPLE EXTRACTIONS WITH ALVEOLOPLASTY  03/2013   Hattie Perch/notes 01/13/2011  . TONSILLECTOMY      MEDICATIONS: . amLODipine (NORVASC) 10 MG tablet  . aspirin EC 81 MG tablet  . carbamazepine (TEGRETOL) 200 MG tablet  . cetirizine (ZYRTEC) 10 MG tablet  . gabapentin (NEURONTIN) 300 MG capsule  . losartan (COZAAR) 100 MG tablet  . pantoprazole (  PROTONIX) 40 MG tablet  . simvastatin (ZOCOR) 20 MG tablet  . Vitamin D, Ergocalciferol, (DRISDOL) 50000 UNITS CAPS capsule   No current facility-administered medications for this encounter.    Velna Ochs [facility] Short Stay Center/Anesthesiology Phone [phone] 02/23/2018 3:17 PM

## 2018-03-02 ENCOUNTER — Encounter (HOSPITAL_COMMUNITY): Admission: RE | Payer: Self-pay | Source: Ambulatory Visit

## 2018-03-02 ENCOUNTER — Ambulatory Visit (HOSPITAL_COMMUNITY): Admission: RE | Admit: 2018-03-02 | Payer: Medicare HMO | Source: Ambulatory Visit | Admitting: General Surgery

## 2018-03-02 SURGERY — REPAIR, HERNIA, INGUINAL, ADULT
Anesthesia: General | Laterality: Left

## 2018-03-03 ENCOUNTER — Other Ambulatory Visit: Payer: Self-pay

## 2018-03-03 ENCOUNTER — Emergency Department (HOSPITAL_COMMUNITY)
Admission: EM | Admit: 2018-03-03 | Discharge: 2018-03-03 | Disposition: A | Discharge status: Home / Self Care | Payer: Medicare HMO | Attending: Emergency Medicine | Admitting: Emergency Medicine

## 2018-03-03 ENCOUNTER — Encounter (HOSPITAL_COMMUNITY): Payer: Self-pay | Admitting: Emergency Medicine

## 2018-03-03 DIAGNOSIS — R799 Abnormal finding of blood chemistry, unspecified: Secondary | ICD-10-CM | POA: Diagnosis present

## 2018-03-03 DIAGNOSIS — F79 Unspecified intellectual disabilities: Secondary | ICD-10-CM | POA: Insufficient documentation

## 2018-03-03 DIAGNOSIS — Z7982 Long term (current) use of aspirin: Secondary | ICD-10-CM | POA: Diagnosis not present

## 2018-03-03 DIAGNOSIS — Z79899 Other long term (current) drug therapy: Secondary | ICD-10-CM | POA: Insufficient documentation

## 2018-03-03 DIAGNOSIS — I10 Essential (primary) hypertension: Secondary | ICD-10-CM | POA: Insufficient documentation

## 2018-03-03 DIAGNOSIS — E871 Hypo-osmolality and hyponatremia: Secondary | ICD-10-CM | POA: Diagnosis not present

## 2018-03-03 LAB — CBC
HEMATOCRIT: 31.8 % — AB (ref 39.0–52.0)
Hemoglobin: 11.3 g/dL — ABNORMAL LOW (ref 13.0–17.0)
MCH: 30.1 pg (ref 26.0–34.0)
MCHC: 35.5 g/dL (ref 30.0–36.0)
MCV: 84.6 fL (ref 78.0–100.0)
PLATELETS: 314 10*3/uL (ref 150–400)
RBC: 3.76 MIL/uL — ABNORMAL LOW (ref 4.22–5.81)
RDW: 13.6 % (ref 11.5–15.5)
WBC: 5.8 10*3/uL (ref 4.0–10.5)

## 2018-03-03 LAB — BASIC METABOLIC PANEL
ANION GAP: 10 (ref 5–15)
BUN: 16 mg/dL (ref 8–23)
CO2: 23 mmol/L (ref 22–32)
Calcium: 8.6 mg/dL — ABNORMAL LOW (ref 8.9–10.3)
Chloride: 92 mmol/L — ABNORMAL LOW (ref 98–111)
Creatinine, Ser: 1.16 mg/dL (ref 0.61–1.24)
GFR calc Af Amer: >60 mL/min (ref >60)
GLUCOSE: 119 mg/dL — AB (ref 70–99)
POTASSIUM: 3.8 mmol/L (ref 3.5–5.1)
Sodium: 125 mmol/L — ABNORMAL LOW (ref 135–145)

## 2018-03-03 NOTE — ED Provider Notes (Signed)
[facility] EMERGENCY DEPARTMENT Provider Note   CSN: [id number] Arrival date & time: 03/03/18  0911     History   Chief Complaint Chief Complaint  Patient presents with  . Abnormal Lab    HPI HAMED DEBELLA is a 67 y.o. male.  Patient is a 67 year old male with a history of MR, seizures and hypertension who presents with abnormal blood work.  He has a left inguinal hernia.  He was supposed to have surgery on this on June 26.  However his preop labs showed a sodium of 119.  He was told to come to the emergency department because of this low sodium.  His sister who is his caretaker states that he has his normal mental status.  She has not noticed any recent illnesses.  No change in his balance.  He does not drink much water.  He does have a history of hyponatremia.     Past Medical History:  Diagnosis Date  . Accidental carbamazepine overdose 05/15/2014; 08/05/2017   Hattie Perch 05/16/2014; Hattie Perch 08/05/2017  . GERD (gastroesophageal reflux disease)   . Hypertension   . Hyponatremia 08/05/2017   severe/notes 08/05/2017  . Inguinal hernia    Left  . Mental retardation   . Right lower lobe pneumonia (HCC) 08/05/2017   hx/notes 08/05/2017  . 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 Acute hyponatremia 08/05/2017  . Hyperlipemia 08/05/2017  . Accidental overdose by carbamazepine 05/15/2014  . Seizures (HCC)   . Mental retardation   . Hypertension     Past Surgical History:  Procedure Laterality Date  . APPENDECTOMY    . MULTIPLE EXTRACTIONS WITH ALVEOLOPLASTY  03/2013   Hattie Perch 01/13/2011  . TONSILLECTOMY          Home Medications    Prior to Admission medications   Medication Sig Start Date End Date Taking? Authorizing Provider  amLODipine (NORVASC) 10 MG tablet Take 1 tablet (10 mg total) by mouth daily. 08/09/17   Arrien, York Ram, MD    aspirin EC 81 MG tablet Take 81 mg by mouth daily. Takes daily at [employer], Historical, MD  carbamazepine (TEGRETOL) 200 MG tablet Take 400 mg by mouth 3 (three) times daily.    [provider]  cetirizine (ZYRTEC) 10 MG tablet Take 10 mg by mouth daily.    [provider]  gabapentin (NEURONTIN) 300 MG capsule Take 300 mg by mouth 3 (three) times daily.    [provider]  losartan (COZAAR) 100 MG tablet Take 100 mg by mouth daily.    [provider]  pantoprazole (PROTONIX) 40 MG tablet  12/29/14   [provider]  simvastatin (ZOCOR) 20 MG tablet Take 20 mg by mouth every evening.    [provider]  Vitamin D, Ergocalciferol, (DRISDOL) 50000 UNITS CAPS capsule Take 50,000 Units by mouth every 7 (seven) days. Every Friday    [provider]    Family History No family history on file.  Social History Social History   Tobacco Use  . Smoking status: Never Smoker  . Smokeless tobacco: Never Used  Substance Use Topics  . Alcohol use: No  . Drug use: No     Allergies   Patient has no known allergies.   Review of Systems Review of Systems  Unable to perform ROS:  Mental status change     Physical Exam Updated Vital Signs BP (!) 115/95   Pulse 77   Temp 98.9 F (37.2 C) (Oral)   Ht 5\' 9"  (1.753 m)   Wt 88.5 kg (195 lb)   SpO2 100%   BMI 28.80 kg/m   Physical Exam  Constitutional: He appears well-developed and well-nourished.  HENT:  Head: Normocephalic and atraumatic.  Eyes: Pupils are equal, round, and reactive to light.  Neck: Normal range of motion. Neck supple.  Cardiovascular: Normal rate, regular rhythm and normal heart sounds.  Pulmonary/Chest: Effort normal and breath sounds normal. No respiratory distress. He has no wheezes. He has no rales. He exhibits no tenderness.  Abdominal: Soft. Bowel sounds are normal. There is no tenderness. There is no rebound and no guarding.  Musculoskeletal:  Normal range of motion. He exhibits no edema.  Lymphadenopathy:    He has no cervical adenopathy.  Neurological: He is alert.  Oriented to person and place, he is pleasant and conversive.  He moves all extremities symmetrically.  His sister states this is his baseline mental status  Skin: Skin is warm and dry. No rash noted.  Psychiatric: He has a normal mood and affect.     ED Treatments / Results  Labs (all labs ordered are listed, but only abnormal results are displayed) Labs Reviewed  CBC - Abnormal; Notable for the following components:      Result Value   RBC 3.76 (*)    Hemoglobin 11.3 (*)    HCT 31.8 (*)    All other components within normal limits  BASIC METABOLIC PANEL - Abnormal; Notable for the following components:   Sodium 125 (*)    Chloride 92 (*)    Glucose, Bld 119 (*)    Calcium 8.6 (*)    All other components within normal limits    EKG EKG Interpretation  Date/Time:  Thursday March 03 2018 10:12:51 EDT Ventricular Rate:  76 PR Interval:    QRS Duration: 92 QT Interval:  383 QTC Calculation: 431 R Axis:   61 Text Interpretation:  Sinus rhythm Posterior infarct, old Minimal ST depression, diffuse leads Baseline wander in lead(s) V2 since last tracing no significant change Confirmed by Rolan BuccoBelfi, Cherrill Scrima [phone]([id number]) on 03/03/2018 10:15:21 AM   Radiology No results found.  Procedures Procedures (including critical care time)  Medications Ordered in ED Medications - No data to display   Initial Impression / Assessment and Plan / ED Course  I have reviewed the triage vital signs and the nursing notes.  Pertinent labs & imaging results that were available during my care of the patient were reviewed by me and considered in my medical decision making (see chart for details).     Patient is a 67 year old male who presents with hyponatremia.  He does not appear to be symptomatic from this.  He has had no seizure activity.  His sodium was rechecked today and is  125.  This seems to be more consistent with his baseline levels.  He was admitted in December for committee acquired pneumonia and had significant hyponatremia at that time.  This resolved with IV fluids.  Had a discharge sodium of 128.  His hydrochlorothiazide was discontinued.  It is unclear what is leading to the chronic hyponatremia although he does seem to have poor oral intake.  His sister was encouraged to add some salt to his diet and discuss ongoing management with his PCP.  She was advised that he needs to  have a sodium recheck within the next week.  Return precautions were given.  Final Clinical Impressions(s) / ED Diagnoses   Final diagnoses:  Hyponatremia    ED Discharge Orders    None       Rolan Bucco, MD 03/03/18 1028

## 2018-03-03 NOTE — Discharge Instructions (Addendum)
YOU NEED TO HAVE YOUR SODIUM (SALT) LEVEL RECHECKED WITHIN THE NEXT WEEK. RETURN HERE FOR ANY WORSENING SYMPTOMS.

## 2018-03-03 NOTE — ED Triage Notes (Signed)
Brother in law stated, called to come in for SOdium was off

## 2018-03-28 ENCOUNTER — Encounter: Payer: Self-pay | Admitting: Podiatry

## 2018-03-28 ENCOUNTER — Ambulatory Visit (INDEPENDENT_AMBULATORY_CARE_PROVIDER_SITE_OTHER): Payer: Medicare HMO | Admitting: Podiatry

## 2018-03-28 DIAGNOSIS — B351 Tinea unguium: Secondary | ICD-10-CM

## 2018-03-28 DIAGNOSIS — M79674 Pain in right toe(s): Secondary | ICD-10-CM

## 2018-03-28 DIAGNOSIS — M79675 Pain in left toe(s): Secondary | ICD-10-CM

## 2018-04-02 NOTE — Progress Notes (Signed)
   SUBJECTIVE Patient presents to office today complaining of elongated, thickened nails that cause pain while ambulating in shoes. He is unable to trim his own nails. Patient is here for further evaluation and treatment.  Past Medical History:  Diagnosis Date  . Accidental carbamazepine overdose 05/15/2014; 08/05/2017   Hattie Perch/notes 05/16/2014; Hattie Perch/notes 08/05/2017  . GERD (gastroesophageal reflux disease)   . Hypertension   . Hyponatremia 08/05/2017   severe/notes 08/05/2017  . Inguinal hernia    Left  . Mental retardation   . Right lower lobe pneumonia (HCC) 08/05/2017   hx/notes 08/05/2017  . 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 Derm Skin is dry and supple bilateral. Negative open lesions or macerations. Remaining integument unremarkable. Nails are tender, long, thickened and dystrophic with subungual debris, consistent with onychomycosis, 1-5 bilateral. No signs of infection noted. Vasc  DP and PT pedal pulses palpable bilaterally. Temperature gradient within normal limits.  Neuro Epicritic and protective threshold sensation grossly intact bilaterally.  Musculoskeletal Exam No symptomatic pedal deformities noted bilateral. Muscular strength within normal limits.  ASSESSMENT 1. Onychodystrophic nails 1-5 bilateral with hyperkeratosis of nails.  2. Onychomycosis of nail due to dermatophyte bilateral 3. Pain in foot bilateral  PLAN OF CARE 1. Patient evaluated today.  2. Instructed to maintain good pedal hygiene and foot care.  3. Mechanical debridement of nails 1-5 bilaterally performed using a nail nipper. Filed with dremel without incident.  4. Return to clinic in 3 mos.    Felecia ShellingBrent M. Pranay Hilbun, DPM [facility]  Dr. Felecia ShellingBrent M. Belanna Manring, DPM    [address]                                        [place], [place] [address]                 Office [phone]  Fax [phone]

## 2018-06-28 ENCOUNTER — Ambulatory Visit: Payer: Medicare HMO | Admitting: Podiatry

## 2018-06-29 ENCOUNTER — Ambulatory Visit: Payer: Medicare HMO | Admitting: Podiatry

## 2018-08-10 ENCOUNTER — Encounter: Payer: Self-pay | Admitting: Podiatry

## 2018-08-10 ENCOUNTER — Ambulatory Visit (INDEPENDENT_AMBULATORY_CARE_PROVIDER_SITE_OTHER): Payer: Medicare HMO | Admitting: Podiatry

## 2018-08-10 DIAGNOSIS — M79674 Pain in right toe(s): Secondary | ICD-10-CM

## 2018-08-10 DIAGNOSIS — B351 Tinea unguium: Secondary | ICD-10-CM | POA: Diagnosis not present

## 2018-08-10 DIAGNOSIS — M79675 Pain in left toe(s): Secondary | ICD-10-CM | POA: Diagnosis not present

## 2018-08-10 NOTE — Progress Notes (Signed)
Subjective: Craig Yates presents today with painful, thick toenails 1-5 b/l that she cannot cut and which interfere with daily activities.  Pain is aggravated when wearing enclosed shoe gear.   Current Outpatient Medications:  .  amLODipine (NORVASC) 10 MG tablet, Take 1 tablet (10 mg total) by mouth daily., Disp: 30 tablet, Rfl: 0 .  aspirin EC 81 MG tablet, Take 81 mg by mouth daily. Takes daily at 2pm, Disp: , Rfl:  .  carbamazepine (TEGRETOL) 200 MG tablet, Take 400 mg by mouth 3 (three) times daily., Disp: , Rfl:  .  cetirizine (ZYRTEC) 10 MG tablet, Take 10 mg by mouth daily., Disp: , Rfl:  .  gabapentin (NEURONTIN) 300 MG capsule, Take 300 mg by mouth 3 (three) times daily., Disp: , Rfl:  .  losartan (COZAAR) 100 MG tablet, Take 100 mg by mouth daily., Disp: , Rfl:  .  pantoprazole (PROTONIX) 40 MG tablet, , Disp: , Rfl:  .  simvastatin (ZOCOR) 20 MG tablet, Take 20 mg by mouth every evening., Disp: , Rfl:  .  Vitamin D, Ergocalciferol, (DRISDOL) 50000 UNITS CAPS capsule, Take 50,000 Units by mouth every 7 (seven) days. Every Friday, Disp: , Rfl:   No Known Allergies  Objective:  Vascular Examination: Capillary refill time immediate x 10 digits Dorsalis pedis and Posterior tibial pulses palpable b/l Digital hair present x 10 digits Skin temperature gradient WNL b/l  Dermatological Examination: Skin with normal turgor, texture and tone b/l  Toenails 1-5 b/l discolored, thick, dystrophic with subungual debris and pain with palpation to nailbeds due to thickness of nails.  Musculoskeletal: Muscle strength 5/5 to all LE muscle groups  Neurological: Sensation intact with 10 gram monofilament. Vibratory sensation intact.  Assessment: Painful onychomycosis toenails 1-5 b/l   Plan: 1. Toenails 1-5 b/l were debrided in length and girth without iatrogenic bleeding. 2. Patient to continue soft, supportive shoe gear 3. Patient to report any pedal injuries to medical  professional immediately. 4. Follow up 3 months. Patient/POA to call should there be a concern in the interim.

## 2018-08-10 NOTE — Patient Instructions (Signed)

## 2018-11-09 ENCOUNTER — Encounter: Payer: Self-pay | Admitting: Podiatry

## 2018-11-09 ENCOUNTER — Other Ambulatory Visit: Payer: Self-pay

## 2018-11-09 ENCOUNTER — Ambulatory Visit (INDEPENDENT_AMBULATORY_CARE_PROVIDER_SITE_OTHER): Payer: Medicare HMO | Admitting: Podiatry

## 2018-11-09 DIAGNOSIS — M79674 Pain in right toe(s): Secondary | ICD-10-CM

## 2018-11-09 DIAGNOSIS — M79675 Pain in left toe(s): Secondary | ICD-10-CM | POA: Diagnosis not present

## 2018-11-09 DIAGNOSIS — B351 Tinea unguium: Secondary | ICD-10-CM

## 2018-11-09 NOTE — Patient Instructions (Signed)

## 2018-11-09 NOTE — Progress Notes (Signed)
Subjective:  Patient presents to clinic with cc of  painful, thick, discolored, elongated toenails 1-5 b/l that become tender and cannot cut because of thickness.  Craig Yates has history of seizures and is accompanied by his sister today.  Fleet Contras, MD is his PCP.   Current Outpatient Medications:  .  amLODipine (NORVASC) 10 MG tablet, Take 1 tablet (10 mg total) by mouth daily., Disp: 30 tablet, Rfl: 0 .  aspirin EC 81 MG tablet, Take 81 mg by mouth daily. Takes daily at 2pm, Disp: , Rfl:  .  carbamazepine (TEGRETOL) 200 MG tablet, Take 400 mg by mouth 3 (three) times daily., Disp: , Rfl:  .  cetirizine (ZYRTEC) 10 MG tablet, Take 10 mg by mouth daily., Disp: , Rfl:  .  gabapentin (NEURONTIN) 300 MG capsule, Take 300 mg by mouth 3 (three) times daily., Disp: , Rfl:  .  losartan (COZAAR) 100 MG tablet, Take 100 mg by mouth daily., Disp: , Rfl:  .  pantoprazole (PROTONIX) 40 MG tablet, , Disp: , Rfl:  .  simvastatin (ZOCOR) 20 MG tablet, Take 20 mg by mouth every evening., Disp: , Rfl:  .  Vitamin D, Ergocalciferol, (DRISDOL) 50000 UNITS CAPS capsule, Take 50,000 Units by mouth every 7 (seven) days. Every Friday, Disp: , Rfl:    No Known Allergies   Objective:  Physical Examination: Neurovascular status intact with thick, discolored brittle toenails 1-5 b/l  Assessment: Mycotic nail infection with pain 1-5 b/l  Plan: Debride painful toenails 1-5 b/l with no iatrogenic bleeding. Continue soft supportive shoe gear daily. Report any pedal injuries to medical professional immediately. Follow up 3 months. POA to call should there be any questions or concerns in the interim.

## 2019-01-04 ENCOUNTER — Ambulatory Visit: Payer: Self-pay | Admitting: General Surgery

## 2019-01-13 IMAGING — CR DG CHEST 2V
2 series · 2 of 2 positions shown · non-contrast
Comparison: None.

CLINICAL DATA: Cough and fever.

EXAM:
CHEST  2 VIEW

[chest lat]
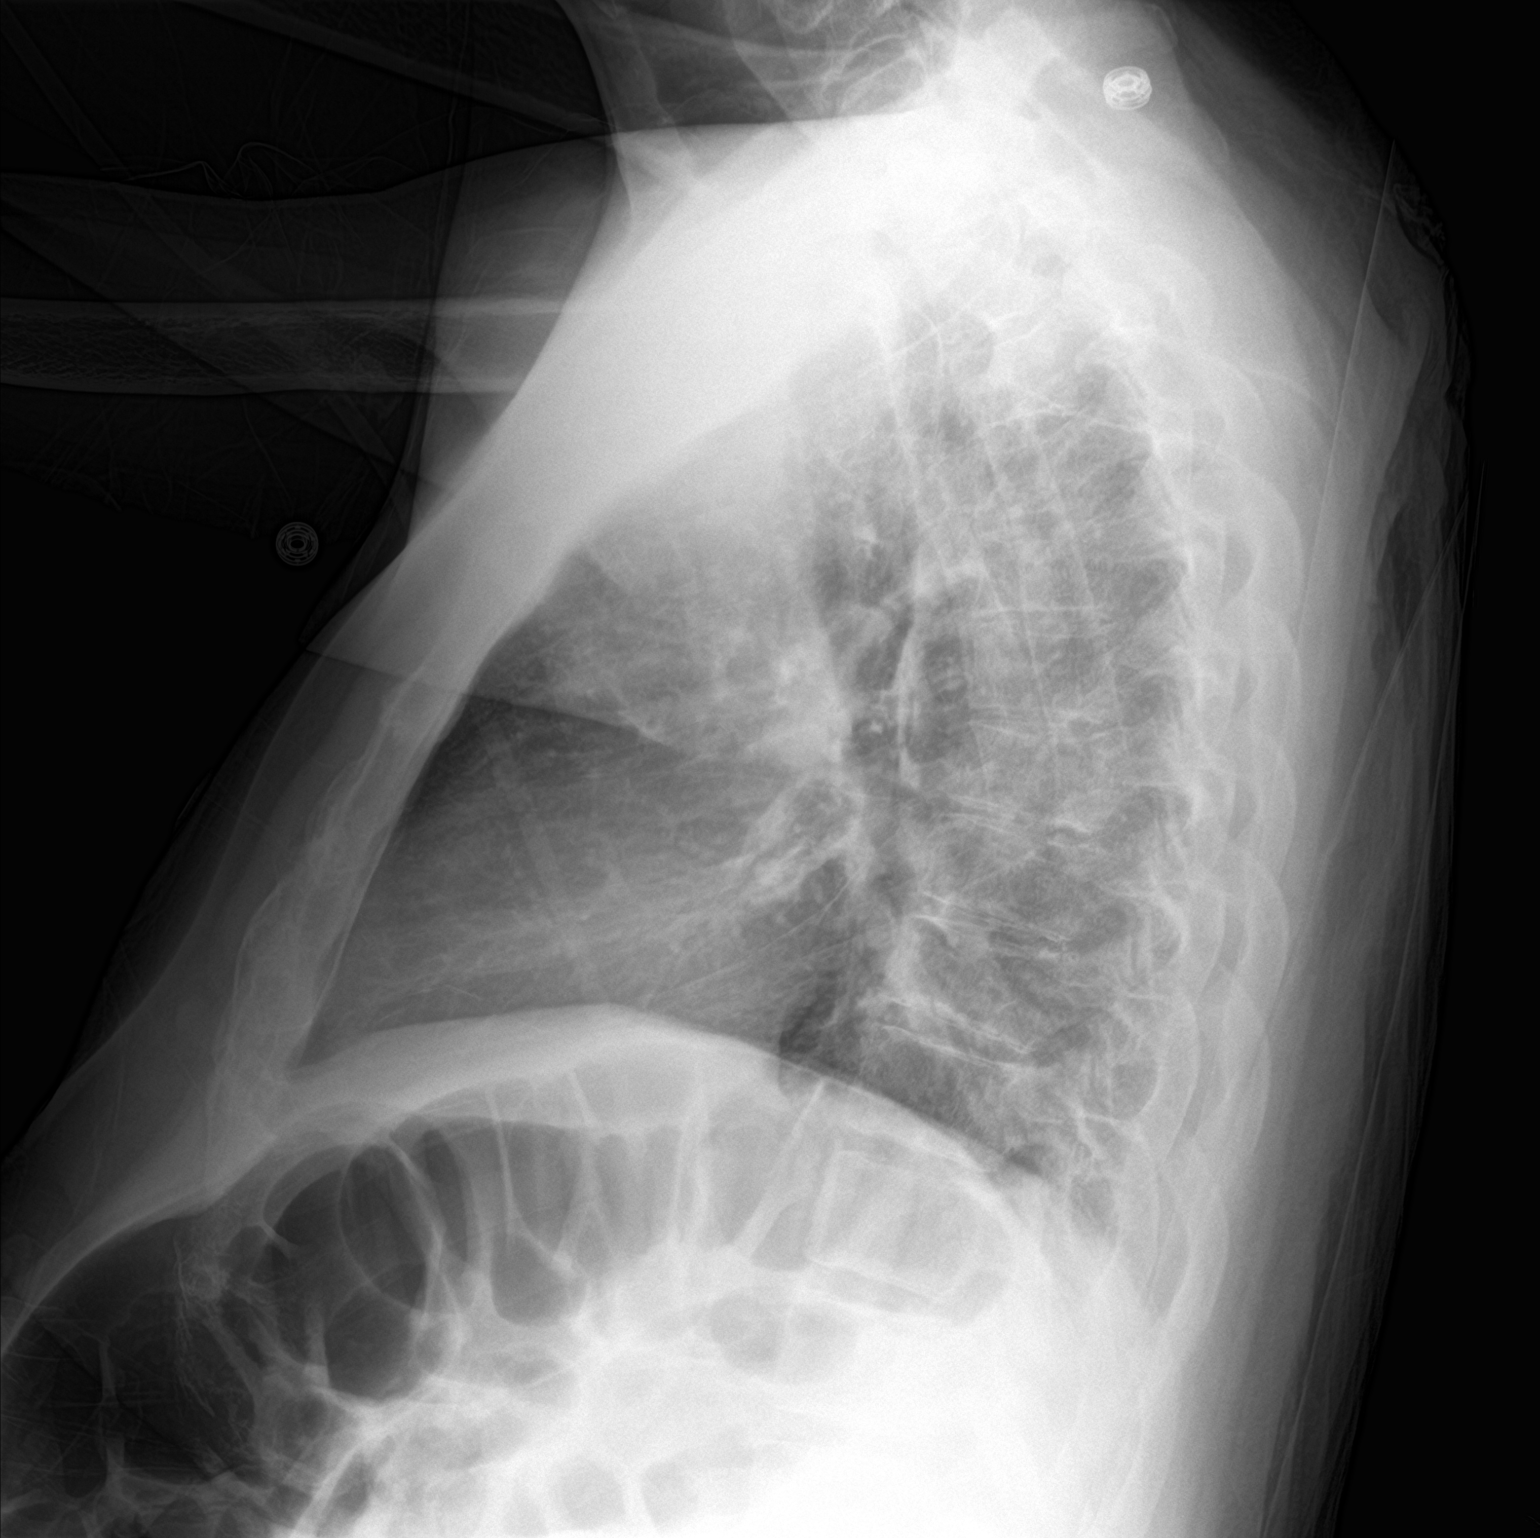

[chest ap]
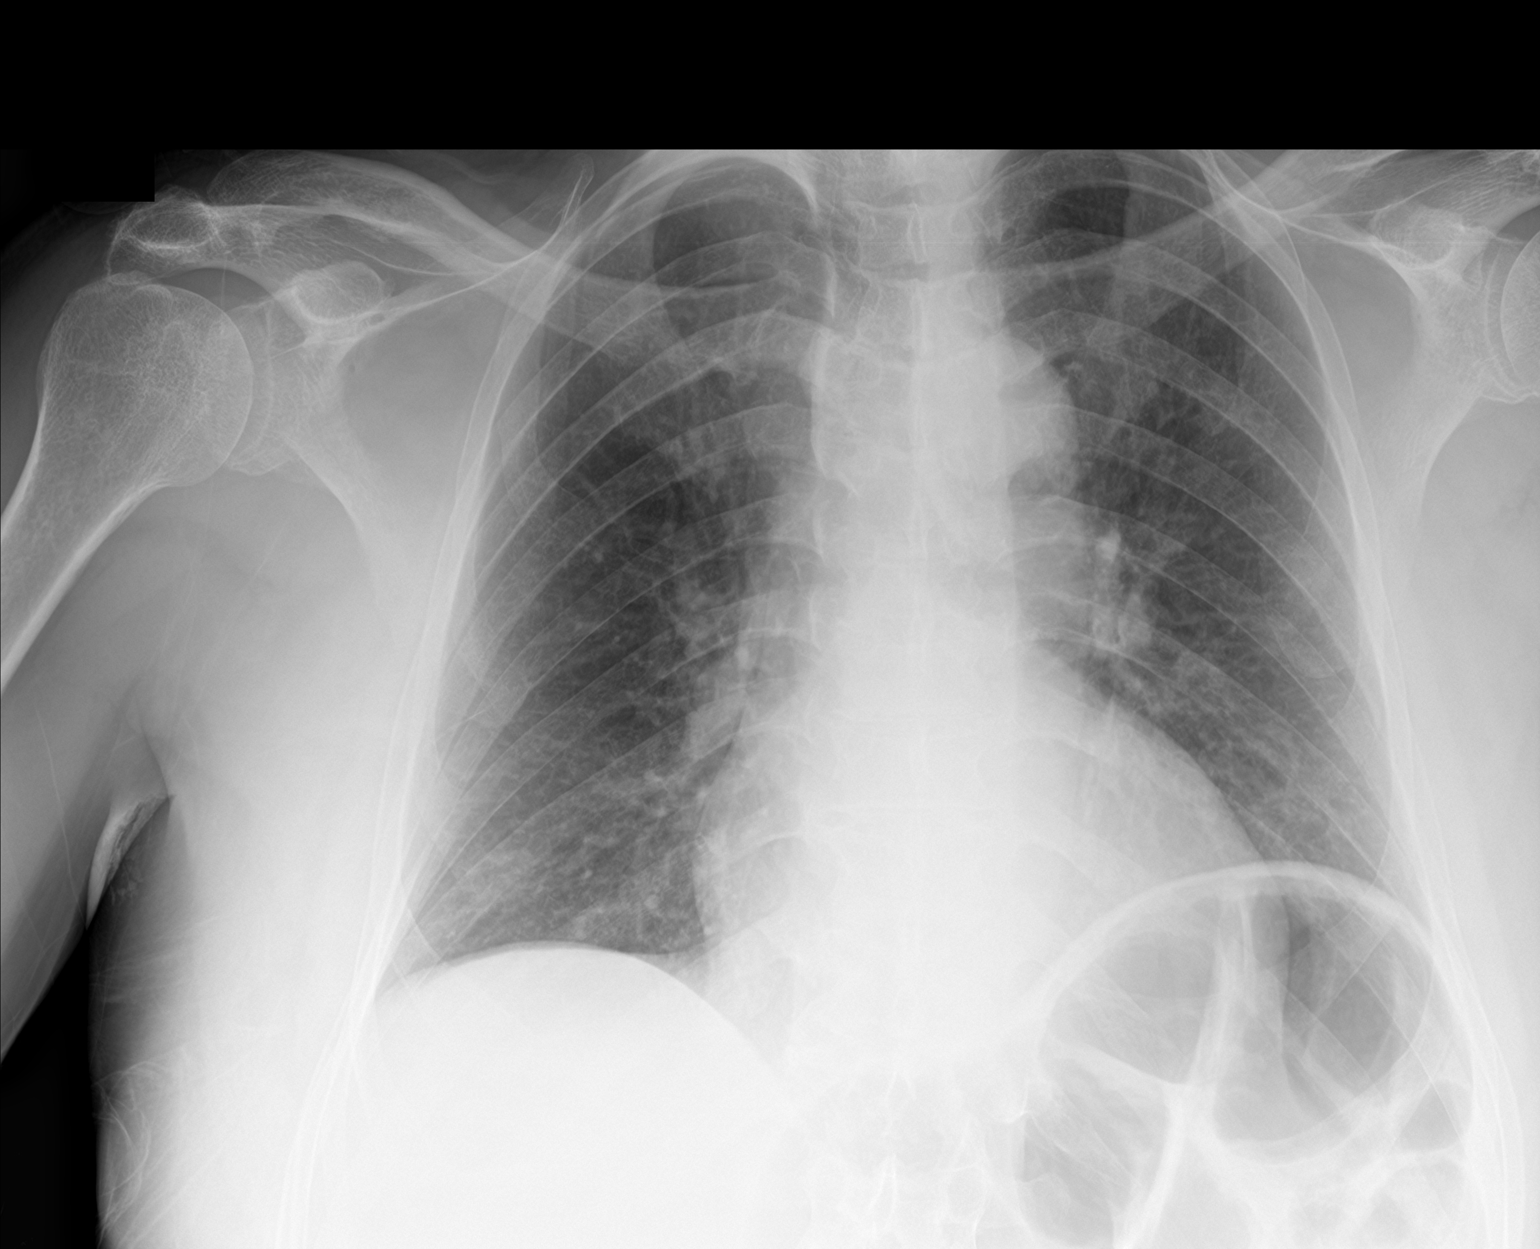

[2 of 2 positions shown; findings below may reference images not displayed]

FINDINGS: Heart at the upper limits of normal in size. Mild aortic tortuosity.
Lung volumes are low. Pulmonary vasculature is normal. No
consolidation, pleural effusion, or pneumothorax. No acute osseous
abnormalities are seen.
IMPRESSION: No acute abnormality.

## 2019-02-15 ENCOUNTER — Ambulatory Visit: Payer: Medicare HMO | Admitting: Podiatry

## 2019-02-24 ENCOUNTER — Other Ambulatory Visit (HOSPITAL_COMMUNITY)
Admission: RE | Admit: 2019-02-24 | Discharge: 2019-02-24 | Disposition: A | Discharge status: Home / Self Care | Payer: Medicare HMO | Source: Ambulatory Visit | Attending: General Surgery | Admitting: General Surgery

## 2019-02-24 DIAGNOSIS — Z1159 Encounter for screening for other viral diseases: Secondary | ICD-10-CM | POA: Insufficient documentation

## 2019-02-24 LAB — SARS CORONAVIRUS 2 (TAT 6-24 HRS): SARS Coronavirus 2: NEGATIVE

## 2019-02-24 NOTE — Progress Notes (Signed)
Unable to contact Craig Yates to schedule Covid 19 screen for procedure 6/30. Unable to leave message. Mailbox full.

## 2019-02-24 NOTE — Progress Notes (Signed)
Have not been able to get in contact with this pt.  Noted in chart by office that pt is mentally disabled and phone number is his sister's # .

## 2019-02-27 ENCOUNTER — Other Ambulatory Visit: Payer: Self-pay

## 2019-02-27 ENCOUNTER — Encounter (HOSPITAL_BASED_OUTPATIENT_CLINIC_OR_DEPARTMENT_OTHER): Payer: Self-pay | Admitting: *Deleted

## 2019-02-27 NOTE — Progress Notes (Addendum)
Pt did get his covid test done 02-24-2019.  However, have not been able to reach his sister to complete pre-op interview , on Friday evening and several times today.  Called and lvm for brenda garrett , or scheduler for [facility], for help to contact pt's sister. Since brenda was successful in contacting her Friday to covid test.

## 2019-02-27 NOTE — Progress Notes (Addendum)
Spoke w/ pt sister, Ola, via phone for pre-op interview.  Pt has mental disablility.  Pt lives w/ his sister whom cares for him.  Sister verbalized understanding for pt to be npo after mn w/ exception sips of water with am meds with exception no losartan if he takes in the am.   Sister somewhat a good historian, she was not understanding about clear liquid diet and pre-surgery drink,  so just had him be npo after mn, to be safe.   Needs istat.  Current ekg in chart and epic.  Per dr Kieth Brightly in special needs on posting ok for family member to be with him in pre-op,  he did not Probation officer order.  It was very hard to get up with pt sister via phone only when I called dr Kieth Brightly office and spoke w/ or scheduler , brenda garrett,  for help , would I get a call from pt sister.  Chart to be reviewed by anesthesia, Konrad Felix PA.

## 2019-02-27 NOTE — Progress Notes (Signed)
SPOKE W/  _  Pt sister,  Via phone     SCREENING SYMPTOMS OF COVID 19:   COUGH-- no  RUNNY NOSE---  no  SORE THROAT--- no  NASAL CONGESTION---- no  SNEEZING---- no  SHORTNESS OF BREATH--- no  DIFFICULTY BREATHING--- no  TEMP >100.0 ----- no  UNEXPLAINED BODY ACHES------ no  CHILLS --------  no  HEADACHES --------- no  LOSS OF SMELL/ TASTE -------- no    HAVE YOU OR ANY FAMILY MEMBER TRAVELLED PAST 14 DAYS OUT OF THE   COUNTY--- no STATE---- no COUNTRY---- no  HAVE YOU OR ANY FAMILY MEMBER BEEN EXPOSED TO ANYONE WITH COVID 19?   denies

## 2019-02-28 ENCOUNTER — Encounter (HOSPITAL_BASED_OUTPATIENT_CLINIC_OR_DEPARTMENT_OTHER)
Admission: RE | Disposition: A | Discharge status: Home / Self Care | Payer: Self-pay | Source: Home / Self Care | Attending: General Surgery

## 2019-02-28 ENCOUNTER — Ambulatory Visit (HOSPITAL_BASED_OUTPATIENT_CLINIC_OR_DEPARTMENT_OTHER): Payer: Medicare HMO | Admitting: Physician Assistant

## 2019-02-28 ENCOUNTER — Ambulatory Visit (HOSPITAL_BASED_OUTPATIENT_CLINIC_OR_DEPARTMENT_OTHER)
Admission: RE | Admit: 2019-02-28 | Discharge: 2019-02-28 | Disposition: A | Discharge status: Home / Self Care | Payer: Medicare HMO | Attending: General Surgery | Admitting: General Surgery

## 2019-02-28 ENCOUNTER — Other Ambulatory Visit: Payer: Self-pay

## 2019-02-28 ENCOUNTER — Encounter (HOSPITAL_BASED_OUTPATIENT_CLINIC_OR_DEPARTMENT_OTHER): Payer: Self-pay | Admitting: Anesthesiology

## 2019-02-28 DIAGNOSIS — Z79899 Other long term (current) drug therapy: Secondary | ICD-10-CM | POA: Insufficient documentation

## 2019-02-28 DIAGNOSIS — E785 Hyperlipidemia, unspecified: Secondary | ICD-10-CM | POA: Insufficient documentation

## 2019-02-28 DIAGNOSIS — F79 Unspecified intellectual disabilities: Secondary | ICD-10-CM | POA: Insufficient documentation

## 2019-02-28 DIAGNOSIS — Z7982 Long term (current) use of aspirin: Secondary | ICD-10-CM | POA: Insufficient documentation

## 2019-02-28 DIAGNOSIS — I10 Essential (primary) hypertension: Secondary | ICD-10-CM | POA: Insufficient documentation

## 2019-02-28 DIAGNOSIS — K4090 Unilateral inguinal hernia, without obstruction or gangrene, not specified as recurrent: Secondary | ICD-10-CM | POA: Insufficient documentation

## 2019-02-28 DIAGNOSIS — K219 Gastro-esophageal reflux disease without esophagitis: Secondary | ICD-10-CM | POA: Insufficient documentation

## 2019-02-28 HISTORY — PX: INGUINAL HERNIA REPAIR: SHX194

## 2019-02-28 HISTORY — DX: Other specified personal risk factors, not elsewhere classified: Z91.89

## 2019-02-28 LAB — POCT I-STAT 4, (NA,K, GLUC, HGB,HCT)
Glucose, Bld: 88 mg/dL (ref 70–99)
HCT: 40 % (ref 39.0–52.0)
Hemoglobin: 13.6 g/dL (ref 13.0–17.0)
Potassium: 4.8 mmol/L (ref 3.5–5.1)
Sodium: 132 mmol/L — ABNORMAL LOW (ref 135–145)

## 2019-02-28 SURGERY — REPAIR, HERNIA, INGUINAL, ADULT
Anesthesia: General | Laterality: Left

## 2019-02-28 MED ORDER — FENTANYL CITRATE (PF) 100 MCG/2ML IJ SOLN
100.0000 ug | Freq: Once | INTRAMUSCULAR | Status: AC
  Administered 2019-02-28: 09:00:00 100 ug via INTRAVENOUS
  Filled 2019-02-28: qty 2

## 2019-02-28 MED ORDER — LACTATED RINGERS IV SOLN
INTRAVENOUS | Status: DC
  Administered 2019-02-28: 08:00:00 via INTRAVENOUS
  Filled 2019-02-28: qty 1000

## 2019-02-28 MED ORDER — OXYCODONE HCL 5 MG PO TABS: 5.0000 mg | ORAL_TABLET | Freq: Four times a day (QID) | ORAL | 0 refills | Status: AC | PRN

## 2019-02-28 MED ORDER — FENTANYL CITRATE (PF) 100 MCG/2ML IJ SOLN
INTRAMUSCULAR | Status: AC
  Filled 2019-02-28: qty 2

## 2019-02-28 MED ORDER — ONDANSETRON HCL 4 MG/2ML IJ SOLN
INTRAMUSCULAR | Status: DC | PRN
  Administered 2019-02-28: 4 mg via INTRAVENOUS

## 2019-02-28 MED ORDER — SUCCINYLCHOLINE CHLORIDE 200 MG/10ML IV SOSY
PREFILLED_SYRINGE | INTRAVENOUS | Status: AC
  Filled 2019-02-28: qty 10

## 2019-02-28 MED ORDER — PROPOFOL 10 MG/ML IV BOLUS
INTRAVENOUS | Status: AC
  Filled 2019-02-28: qty 20

## 2019-02-28 MED ORDER — ONDANSETRON HCL 4 MG/2ML IJ SOLN
INTRAMUSCULAR | Status: AC
  Filled 2019-02-28: qty 2

## 2019-02-28 MED ORDER — BUPIVACAINE LIPOSOME 1.3 % IJ SUSP
INTRAMUSCULAR | Status: AC
  Filled 2019-02-28: qty 20

## 2019-02-28 MED ORDER — PHENYLEPHRINE 40 MCG/ML (10ML) SYRINGE FOR IV PUSH (FOR BLOOD PRESSURE SUPPORT)
PREFILLED_SYRINGE | INTRAVENOUS | Status: DC | PRN
  Administered 2019-02-28 (×3): 80 ug via INTRAVENOUS

## 2019-02-28 MED ORDER — LIDOCAINE 2% (20 MG/ML) 5 ML SYRINGE
INTRAMUSCULAR | Status: AC
  Filled 2019-02-28: qty 5

## 2019-02-28 MED ORDER — DEXAMETHASONE SODIUM PHOSPHATE 4 MG/ML IJ SOLN
INTRAMUSCULAR | Status: DC | PRN
  Administered 2019-02-28: 10 mg via INTRAVENOUS

## 2019-02-28 MED ORDER — KETOROLAC TROMETHAMINE 30 MG/ML IJ SOLN
INTRAMUSCULAR | Status: AC
  Filled 2019-02-28: qty 1

## 2019-02-28 MED ORDER — OXYCODONE HCL 5 MG PO TABS
5.0000 mg | ORAL_TABLET | Freq: Once | ORAL | Status: DC | PRN
  Filled 2019-02-28: qty 1

## 2019-02-28 MED ORDER — SUGAMMADEX SODIUM 200 MG/2ML IV SOLN
INTRAVENOUS | Status: DC | PRN
  Administered 2019-02-28: 200 mg via INTRAVENOUS

## 2019-02-28 MED ORDER — ONDANSETRON HCL 4 MG/2ML IJ SOLN
4.0000 mg | Freq: Once | INTRAMUSCULAR | Status: DC | PRN
  Filled 2019-02-28: qty 2

## 2019-02-28 MED ORDER — ROCURONIUM BROMIDE 10 MG/ML (PF) SYRINGE
PREFILLED_SYRINGE | INTRAVENOUS | Status: DC | PRN
  Administered 2019-02-28: 20 mg via INTRAVENOUS
  Administered 2019-02-28: 50 mg via INTRAVENOUS

## 2019-02-28 MED ORDER — ACETAMINOPHEN 500 MG PO TABS
1000.0000 mg | ORAL_TABLET | ORAL | Status: AC
  Administered 2019-02-28: 1000 mg via ORAL
  Filled 2019-02-28: qty 2

## 2019-02-28 MED ORDER — ACETAMINOPHEN 500 MG PO TABS
ORAL_TABLET | ORAL | Status: AC
  Filled 2019-02-28: qty 2

## 2019-02-28 MED ORDER — OXYCODONE HCL 5 MG/5ML PO SOLN
5.0000 mg | Freq: Once | ORAL | Status: DC | PRN
  Filled 2019-02-28: qty 5

## 2019-02-28 MED ORDER — CEFAZOLIN SODIUM-DEXTROSE 2-4 GM/100ML-% IV SOLN
2.0000 g | INTRAVENOUS | Status: AC
  Administered 2019-02-28: 2 g via INTRAVENOUS
  Filled 2019-02-28: qty 100

## 2019-02-28 MED ORDER — KETOROLAC TROMETHAMINE 15 MG/ML IJ SOLN
15.0000 mg | INTRAMUSCULAR | Status: DC
  Administered 2019-02-28: 15 mg via INTRAVENOUS
  Filled 2019-02-28: qty 1

## 2019-02-28 MED ORDER — ENSURE PRE-SURGERY PO LIQD
296.0000 mL | Freq: Once | ORAL | Status: DC
  Filled 2019-02-28: qty 296

## 2019-02-28 MED ORDER — PROPOFOL 500 MG/50ML IV EMUL
INTRAVENOUS | Status: AC
  Filled 2019-02-28: qty 50

## 2019-02-28 MED ORDER — CHLORHEXIDINE GLUCONATE CLOTH 2 % EX PADS
6.0000 | MEDICATED_PAD | Freq: Once | CUTANEOUS | Status: DC
  Filled 2019-02-28: qty 6

## 2019-02-28 MED ORDER — SUGAMMADEX SODIUM 200 MG/2ML IV SOLN
INTRAVENOUS | Status: AC
  Filled 2019-02-28: qty 2

## 2019-02-28 MED ORDER — SUCCINYLCHOLINE CHLORIDE 20 MG/ML IJ SOLN
INTRAMUSCULAR | Status: DC | PRN
  Administered 2019-02-28: 120 mg via INTRAVENOUS

## 2019-02-28 MED ORDER — ROCURONIUM BROMIDE 10 MG/ML (PF) SYRINGE
PREFILLED_SYRINGE | INTRAVENOUS | Status: AC
  Filled 2019-02-28: qty 10

## 2019-02-28 MED ORDER — PROPOFOL 10 MG/ML IV BOLUS
INTRAVENOUS | Status: DC | PRN
  Administered 2019-02-28: 200 mg via INTRAVENOUS

## 2019-02-28 MED ORDER — DEXAMETHASONE SODIUM PHOSPHATE 10 MG/ML IJ SOLN
INTRAMUSCULAR | Status: AC
  Filled 2019-02-28: qty 1

## 2019-02-28 MED ORDER — LIDOCAINE 2% (20 MG/ML) 5 ML SYRINGE
INTRAMUSCULAR | Status: DC | PRN
  Administered 2019-02-28: 60 mg via INTRAVENOUS

## 2019-02-28 MED ORDER — FENTANYL CITRATE (PF) 100 MCG/2ML IJ SOLN
25.0000 ug | INTRAMUSCULAR | Status: DC | PRN
  Filled 2019-02-28: qty 1

## 2019-02-28 MED ORDER — CEFAZOLIN SODIUM-DEXTROSE 2-4 GM/100ML-% IV SOLN
INTRAVENOUS | Status: AC
  Filled 2019-02-28: qty 100

## 2019-02-28 MED ORDER — BUPIVACAINE-EPINEPHRINE (PF) 0.5% -1:200000 IJ SOLN
INTRAMUSCULAR | Status: DC | PRN
  Administered 2019-02-28: 30 mL via PERINEURAL

## 2019-02-28 SURGICAL SUPPLY — 55 items
ADH SKN CLS APL DERMABOND .7 (GAUZE/BANDAGES/DRESSINGS) ×1
BLADE CLIPPER SENSICLIP SURGIC (BLADE) ×3 IMPLANT
BLADE HEX COATED 2.75 (ELECTRODE) ×3 IMPLANT
BLADE SURG 15 STRL LF DISP TIS (BLADE) ×1 IMPLANT
BLADE SURG 15 STRL SS (BLADE) ×3
CANISTER SUCT 3000ML PPV (MISCELLANEOUS) IMPLANT
CELLS DAT CNTRL 66122 CELL SVR (MISCELLANEOUS) IMPLANT
CHLORAPREP W/TINT 26ML (MISCELLANEOUS) ×3 IMPLANT
COVER BACK TABLE 60X90IN (DRAPES) ×3 IMPLANT
COVER MAYO STAND STRL (DRAPES) ×3 IMPLANT
COVER WAND RF STERILE (DRAPES) ×2 IMPLANT
DERMABOND ADVANCED (GAUZE/BANDAGES/DRESSINGS) ×2
DERMABOND ADVANCED .7 DNX12 (GAUZE/BANDAGES/DRESSINGS) ×1 IMPLANT
DRAIN PENROSE 18X1/4 LTX STRL (WOUND CARE) ×2 IMPLANT
DRAPE LAPAROSCOPIC ABDOMINAL (DRAPES) ×3 IMPLANT
DRAPE UTILITY XL STRL (DRAPES) ×3 IMPLANT
ELECT REM PT RETURN 9FT ADLT (ELECTROSURGICAL) ×3
ELECTRODE REM PT RTRN 9FT ADLT (ELECTROSURGICAL) ×1 IMPLANT
GLOVE BIO SURGEON STRL SZ 6.5 (GLOVE) ×1 IMPLANT
GLOVE BIO SURGEONS STRL SZ 6.5 (GLOVE) ×1
GLOVE BIOGEL PI IND STRL 6.5 (GLOVE) IMPLANT
GLOVE BIOGEL PI IND STRL 7.0 (GLOVE) ×1 IMPLANT
GLOVE BIOGEL PI INDICATOR 6.5 (GLOVE) ×2
GLOVE BIOGEL PI INDICATOR 7.0 (GLOVE) ×4
GLOVE SURG SS PI 7.0 STRL IVOR (GLOVE) ×3 IMPLANT
GOWN STRL REUS W/ TWL LRG LVL3 (GOWN DISPOSABLE) ×2 IMPLANT
GOWN STRL REUS W/TWL LRG LVL3 (GOWN DISPOSABLE) ×6
KIT TURNOVER CYSTO (KITS) ×3 IMPLANT
MESH HERNIA 3X6 (Mesh General) ×2 IMPLANT
NDL HYPO 25X1 1.5 SAFETY (NEEDLE) ×1 IMPLANT
NEEDLE HYPO 25X1 1.5 SAFETY (NEEDLE) IMPLANT
NS IRRIG 500ML POUR BTL (IV SOLUTION) ×3 IMPLANT
PACK BASIN DAY SURGERY FS (CUSTOM PROCEDURE TRAY) ×3 IMPLANT
PAD ARMBOARD 7.5X6 YLW CONV (MISCELLANEOUS) ×3 IMPLANT
PENCIL BUTTON HOLSTER BLD 10FT (ELECTRODE) ×3 IMPLANT
RETRACTOR WND ALEXIS 18 MED (MISCELLANEOUS) IMPLANT
RTRCTR WOUND ALEXIS 18CM MED (MISCELLANEOUS)
SPONGE LAP 4X18 RFD (DISPOSABLE) IMPLANT
SUT MNCRL AB 4-0 PS2 18 (SUTURE) ×3 IMPLANT
SUT PDS AB 0 CT1 36 (SUTURE) ×2 IMPLANT
SUT PROLENE 2 0 CT2 30 (SUTURE) ×6 IMPLANT
SUT SILK 3 0 TIES 17X18 (SUTURE)
SUT SILK 3-0 18XBRD TIE BLK (SUTURE) IMPLANT
SUT VIC AB 2-0 SH 27 (SUTURE) ×3
SUT VIC AB 2-0 SH 27XBRD (SUTURE) ×1 IMPLANT
SUT VIC AB 3-0 SH 27 (SUTURE) ×3
SUT VIC AB 3-0 SH 27X BRD (SUTURE) ×1 IMPLANT
SUT VICRYL 2 0 18  UND BR (SUTURE) ×2
SUT VICRYL 2 0 18 UND BR (SUTURE) ×1 IMPLANT
SYR BULB IRRIGATION 50ML (SYRINGE) ×3 IMPLANT
SYR CONTROL 10ML LL (SYRINGE) ×1 IMPLANT
TOWEL OR 17X26 10 PK STRL BLUE (TOWEL DISPOSABLE) ×3 IMPLANT
TUBE CONNECTING 12'X1/4 (SUCTIONS) ×1
TUBE CONNECTING 12X1/4 (SUCTIONS) ×2 IMPLANT
YANKAUER SUCT BULB TIP NO VENT (SUCTIONS) ×3 IMPLANT

## 2019-02-28 NOTE — H&P (Signed)
Craig Yates is an 68 y.o. male.   Chief Complaint: left inguinal hernia HPI: 68 yo male with long history of left inguinal hernia. This year it has gotten increasingly sore and painful. He has never been tot eh ER for reduction. He denies nausea or vomiting.  Past Medical History:  Diagnosis Date  . GERD (gastroesophageal reflux disease)   . History of drug overdose    in epic documented 09/ 2015  accidental carbamazepine overdose  . Hypertension   . Inguinal hernia    Left  . Mental retardation   . 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 2 wks from 02-27-2019 , stated pt seed neurologist but unable to give the name    Past Surgical History:  Procedure Laterality Date  . APPENDECTOMY  ?  . MULTIPLE EXTRACTIONS WITH ALVEOLOPLASTY  04-04-2003  @[facility]   . TONSILLECTOMY  child    History reviewed. No pertinent family history. Social History:  reports that he has never smoked. He has never used smokeless tobacco. He reports that he does not drink alcohol or use drugs.  Allergies: No Known Allergies  Medications Prior to Admission  Medication Sig Dispense Refill  . amLODipine (NORVASC) 10 MG tablet Take 1 tablet (10 mg total) by mouth daily. (Patient taking differently: Take 10 mg by mouth daily. ) 30 tablet 0  . aspirin EC 81 MG tablet Take 81 mg by mouth daily. Takes daily at 2pm    . carbamazepine (TEGRETOL) 200 MG tablet Take 400 mg by mouth 3 (three) times daily.     . cetirizine (ZYRTEC) 10 MG tablet Take 10 mg by mouth daily.    Marland Kitchen gabapentin (NEURONTIN) 300 MG capsule Take 300 mg by mouth 3 (three) times daily.    Marland Kitchen losartan (COZAAR) 100 MG tablet Take 100 mg by mouth every evening.     . pantoprazole (PROTONIX) 40 MG tablet Take 40 mg by mouth daily.     . simvastatin (ZOCOR) 20 MG tablet Take 20 mg by mouth every evening.    . Vitamin D, Ergocalciferol, (DRISDOL) 50000 UNITS  CAPS capsule Take 50,000 Units by mouth every 7 (seven) days. Every Friday      Results for orders placed or performed during the hospital encounter of 02/28/19 (from the past 48 hour(s))  I-STAT 4, (NA,K, GLUC, HGB,HCT)     Status: Abnormal   Collection Time: 02/28/19  8:22 AM  Result Value Ref Range   Sodium 132 (L) 135 - 145 mmol/L   Potassium 4.8 3.5 - 5.1 mmol/L   Glucose, Bld 88 70 - 99 mg/dL   HCT 40.0 39.0 - 52.0 %   Hemoglobin 13.6 13.0 - 17.0 g/dL   No results found.  Review of Systems  Constitutional: Negative for chills and fever.  HENT: Negative for hearing loss.   Eyes: Negative for blurred vision and double vision.  Respiratory: Negative for cough and hemoptysis.   Cardiovascular: Negative for chest pain and palpitations.  Gastrointestinal: Negative for abdominal pain, nausea and vomiting.  Genitourinary: Negative for dysuria and urgency.  Musculoskeletal: Negative for myalgias and neck pain.  Skin: Negative for itching and rash.  Neurological: Negative for dizziness, tingling and headaches.  Endo/Heme/Allergies: Does not bruise/bleed easily.  Psychiatric/Behavioral: Negative for depression and suicidal ideas.    Blood pressure (!) 173/94, pulse (!) 56, temperature 97.8  F (36.6 C), temperature source Oral, resp. rate (!) 23, height 5\' 9"  (1.753 m), weight 89.5 kg, SpO2 100 %. Physical Exam  Vitals reviewed. Constitutional: He is oriented to person, place, and time. He appears well-developed and well-nourished.  HENT:  Head: Normocephalic and atraumatic.  Eyes: Pupils are equal, round, and reactive to light. Conjunctivae and EOM are normal.  Neck: Normal range of motion. Neck supple.  Cardiovascular: Normal rate and regular rhythm.  Respiratory: Effort normal and breath sounds normal.  GI: Soft. Bowel sounds are normal. He exhibits no distension. There is no abdominal tenderness.  Large left inguinal hernia  Musculoskeletal: Normal range of motion.   Neurological: He is alert and oriented to person, place, and time.  Skin: Skin is warm and dry.  Psychiatric: He has a normal mood and affect. His behavior is normal.     Assessment/Plan 68 yo male with large left inguinal hernia -TAP block by anesthesia -open left inguinal hernia -planned outpatient procedure  Rodman PickleLuke Aaron Kinsinger, MD 02/28/2019, 9:26 AM

## 2019-02-28 NOTE — Progress Notes (Signed)
Assisted Dr. Foster with left, ultrasound guided, transabdominal plane block. Side rails up, monitors on throughout procedure. See vital signs in flow sheet. Tolerated Procedure well. 

## 2019-02-28 NOTE — Anesthesia Procedure Notes (Signed)
Anesthesia Regional Block: TAP block   Pre-Anesthetic Checklist: ,, timeout performed, Correct Patient, Correct Site, Correct Laterality, Correct Procedure, Correct Position, site marked, Risks and benefits discussed,  Surgical consent,  Pre-op evaluation,  At surgeon's request and post-op pain management  Laterality: Left  Prep: chloraprep       Needles:  Injection technique: Single-shot  Needle Type: Echogenic Stimulator Needle     Needle Length: 9cm  Needle Gauge: 21   Needle insertion depth: 7 cm   Additional Needles:   Procedures:,,,, ultrasound used (permanent image in chart),,,,  Narrative:  Start time: 02/28/2019 9:05 AM End time: 02/28/2019 9:10 AM Injection made incrementally with aspirations every 5 mL.  Performed by: Personally  Anesthesiologist: Josephine Igo, MD  Additional Notes: Timeout performed. Patient sedated. Relevant anatomy ID'd using [place]. Incremental 2-62ml injection of LA with frequent aspiration. Patient tolerated procedure well.

## 2019-02-28 NOTE — Discharge Instructions (Signed)
°Post Anesthesia Home Care Instructions ° °Activity: °Get plenty of rest for the remainder of the day. A responsible adult should stay with you for 24 hours following the procedure.  °For the next 24 hours, DO NOT: °-Drive a car °-Operate machinery °-Drink alcoholic beverages °-Take any medication unless instructed by your physician °-Make any legal decisions or sign important papers. ° °Meals: °Start with liquid foods such as gelatin or soup. Progress to regular foods as tolerated. Avoid greasy, spicy, heavy foods. If nausea and/or vomiting occur, drink only clear liquids until the nausea and/or vomiting subsides. Call your physician if vomiting continues. ° °Special Instructions/Symptoms: °Your throat may feel dry or sore from the anesthesia or the breathing tube placed in your throat during surgery. If this causes discomfort, gargle with warm salt water. The discomfort should disappear within 24 hours. ° °If you had a scopolamine patch placed behind your ear for the management of post- operative nausea and/or vomiting: ° °1. The medication in the patch is effective for 72 hours, after which it should be removed.  Wrap patch in a tissue and discard in the trash. Wash hands thoroughly with soap and water. °2. You may remove the patch earlier than 72 hours if you experience unpleasant side effects which may include dry mouth, dizziness or visual disturbances. °3. Avoid touching the patch. Wash your hands with soap and water after contact with the patch. °  °Regional Anesthesia Blocks ° °1. Numbness or the inability to move the "blocked" extremity may last from 3-48 hours after placement. The length of time depends on the medication injected and your individual response to the medication. If the numbness is not going away after 48 hours, call your surgeon. ° °2. The extremity that is blocked will need to be protected until the numbness is gone and the  Strength has returned. Because you cannot feel it, you will need  to take extra care to avoid injury. Because it may be weak, you may have difficulty moving it or using it. You may not know what position it is in without looking at it while the block is in effect. ° °3. For blocks in the legs and feet, returning to weight bearing and walking needs to be done carefully. You will need to wait until the numbness is entirely gone and the strength has returned. You should be able to move your leg and foot normally before you try and bear weight or walk. You will need someone to be with you when you first try to ensure you do not fall and possibly risk injury. ° °4. Bruising and tenderness at the needle site are common side effects and will resolve in a few days. ° °5. Persistent numbness or new problems with movement should be communicated to the surgeon or the [facility] ([phone])/ [facility] ([phone]). °Post Anesthesia Home Care Instructions ° °Activity: °Get plenty of rest for the remainder of the day. A responsible adult should stay with you for 24 hours following the procedure.  °For the next 24 hours, DO NOT: °-Drive a car °-Operate machinery °-Drink alcoholic beverages °-Take any medication unless instructed by your physician °-Make any legal decisions or sign important papers. ° °Meals: °Start with liquid foods such as gelatin or soup. Progress to regular foods as tolerated. Avoid greasy, spicy, heavy foods. If nausea and/or vomiting occur, drink only clear liquids until the nausea and/or vomiting subsides. Call your physician if vomiting continues. ° °Special Instructions/Symptoms: °Your throat   may feel dry or sore from the anesthesia or the breathing tube placed in your throat during surgery. If this causes discomfort, gargle with warm salt water. The discomfort should disappear within 24 hours. ° °If you had a scopolamine patch placed behind your ear for the management of post- operative nausea and/or vomiting: ° °1. The medication in the  patch is effective for 72 hours, after which it should be removed.  Wrap patch in a tissue and discard in the trash. Wash hands thoroughly with soap and water. °2. You may remove the patch earlier than 72 hours if you experience unpleasant side effects which may include dry mouth, dizziness or visual disturbances. °3. Avoid touching the patch. Wash your hands with soap and water after contact with the patch. °  ° °

## 2019-02-28 NOTE — Transfer of Care (Signed)
Immediate Anesthesia Transfer of Care Note  Patient: Craig Yates  Procedure(s) Performed: OPEN LEFT INGUINAL HERNIA REPAIR WITH MESH (Left )  Patient Location: PACU  Anesthesia Type:General  Level of Consciousness: awake and alert   Airway & Oxygen Therapy: Patient Spontanous Breathing and Patient connected to nasal cannula oxygen  Post-op Assessment: Report given to RN and Post -op Vital signs reviewed and stable  Post vital signs: Reviewed and stable  Last Vitals:  Vitals Value Taken Time  BP 147/82 02/28/19 1057  Temp    Pulse 61 02/28/19 1100  Resp 14 02/28/19 1100  SpO2 100 % 02/28/19 1100  Vitals shown include unvalidated device data.  Last Pain:  Vitals:   02/28/19 0701  TempSrc: Oral         Complications: No apparent anesthesia complications

## 2019-02-28 NOTE — Anesthesia Preprocedure Evaluation (Addendum)
Anesthesia Evaluation  Patient identified by MRN, date of birth, ID band Patient awake    Reviewed: Allergy & Precautions, NPO status , Patient's Chart, lab work & pertinent test results  Airway Mallampati: II  TM Distance: >3 FB Neck ROM: Full    Dental  (+) Edentulous Upper, Edentulous Lower   Pulmonary neg pulmonary ROS,    Pulmonary exam normal breath sounds clear to auscultation       Cardiovascular hypertension, Pt. on medications Normal cardiovascular exam Rhythm:Regular Rate:Normal     Neuro/Psych Seizures -, Well Controlled,  PSYCHIATRIC DISORDERS Mental retardation   GI/Hepatic Neg liver ROS, GERD  Controlled and Medicated,  Endo/Other  Hyperlipidemia  Renal/GU negative Renal ROS  negative genitourinary   Musculoskeletal Left inguinal hernia   Abdominal   Peds  Hematology negative hematology ROS (+)   Anesthesia Other Findings   Reproductive/Obstetrics                            Anesthesia Physical Anesthesia Plan  ASA: III  Anesthesia Plan: General   Post-op Pain Management:    Induction:   PONV Risk Score and Plan: 4 or greater and Ondansetron, Treatment may vary due to age or medical condition and Dexamethasone  Airway Management Planned: LMA and Oral ETT  Additional Equipment:   Intra-op Plan:   Post-operative Plan: Extubation in OR  Informed Consent: I have reviewed the patients History and Physical, chart, labs and discussed the procedure including the risks, benefits and alternatives for the proposed anesthesia with the patient or authorized representative who has indicated his/her understanding and acceptance.     Dental advisory given  Plan Discussed with: CRNA and Surgeon  Anesthesia Plan Comments:         Anesthesia Quick Evaluation

## 2019-02-28 NOTE — Anesthesia Procedure Notes (Signed)
Procedure Name: Intubation Date/Time: 02/28/2019 9:38 AM Performed by: Lieutenant Diego, CRNA Pre-anesthesia Checklist: Patient identified, Emergency Drugs available, Suction available and Patient being monitored Patient Re-evaluated:Patient Re-evaluated prior to induction Oxygen Delivery Method: Circle system utilized Preoxygenation: Pre-oxygenation with 100% oxygen Induction Type: IV induction Ventilation: Mask ventilation without difficulty Laryngoscope Size: Miller and 2 Grade View: Grade I Tube type: Oral Tube size: 7.5 mm Number of attempts: 1 Airway Equipment and Method: Stylet and Oral airway Placement Confirmation: ETT inserted through vocal cords under direct vision,  positive ETCO2 and breath sounds checked- equal and bilateral Secured at: 22 cm Tube secured with: Tape Dental Injury: Teeth and Oropharynx as per pre-operative assessment

## 2019-02-28 NOTE — Op Note (Signed)
Preop diagnosis: left inguinal hernia  Postop diagnosis: left direct inguinal hernia  Procedure: open Left inguinal hernia repair with mesh  Surgeon: Gurney Maxin, M.D.  Asst: none  Anesthesia: Gen.   Indications for procedure: Craig Yates is a 68 y.o. male with symptoms of pain and enlarging Left inguinal hernia(s). After discussing risks, alternatives and benefits he decided on open repair and was brought to day surgery for repair.  Description of procedure: The patient was brought into the operative suite, placed supine. Anesthesia was administered with endotracheal tube. Patient was strapped in place. The patient was prepped and draped in the usual sterile fashion.  The anterior superior iliac spine and pubic tubercle were identified on the Left side. An incision was made 1cm above the connecting line, representative of the location of the inguinal ligament. The subcutaneous tissue was bluntly dissected, scarpa's fascia was dissected away. The external abdominal oblique fascia was identified and sharply opened down to the external inguinal ring. The conjoint tendon and inguinal ligament were identified. The cord structures and sac were dissected free of the surrounding tissue in 360 degrees. A penrose drain was used to encircle the contents. The cremasteric fibers were dissected free of the contents of the cord and hernia sac. The cord structures (vessels and vas deferens) were identified and carefully dissected away from the hernia sac. The hernia was quite large and fatty. The hernia sac was dissected down to the internal inguinal ring. Preperitoneal fat was identified showing appropriate dissection. The hernia appeared to be direct with a significant floor defect. The sac was then reduced into the preperitoneal space. 0 PDS was used to appose the inguinal ligament to the conjoint tendon to recreate the inguinal floor. A 3x6 Bard mesh was then used to close the defect and reinforce the  floor. The mesh was sutured to the lacunar ligament and inguinal ligament using a 2-0 prolene in running fashion. Next the superior edge of the mesh was sutured to the conjoined tendon using a 2-0 running Prolene. An additional 2-0 Prolene was used to suture the tail ends of the mesh together re-creating the deep ring. Cord structures are running in a neutral position through the mesh. Next the external abdominal oblique fascia was closed with a 2-0 Vicryl in running fashion to re-create the external inguinal ring. Scarpa's fascia was closed with 3-0 Vicryl in running fashion. Skin was closed with a 4-0 Monocryl subcuticular stitch in running fashion. Dermabond place for dressing. Patient woke from anesthesia and brought to PACU in stable condition. All counts are correct.    Findings: left direct inguinal hernia  Specimen: none  Blood loss: 20 ml  Local anesthesia: preop TAP block  Complications: none  Implant: 3x6 Bard mesh  Gurney Maxin, M.D. General, Bariatric, & Minimally Invasive Surgery [facility], [place] 10:48 AM 02/28/2019

## 2019-02-28 NOTE — Anesthesia Postprocedure Evaluation (Signed)
Anesthesia Post Note  Patient: Craig Yates  Procedure(s) Performed: OPEN LEFT INGUINAL HERNIA REPAIR WITH MESH (Left )     Patient location during evaluation: PACU Anesthesia Type: General Level of consciousness: awake and alert Pain management: pain level controlled Vital Signs Assessment: post-procedure vital signs reviewed and stable Respiratory status: spontaneous breathing, nonlabored ventilation and respiratory function stable Cardiovascular status: blood pressure returned to baseline and stable Postop Assessment: no apparent nausea or vomiting Anesthetic complications: no    Last Vitals:  Vitals:   02/28/19 0925 02/28/19 1100  BP: (!) 219/85 (!) 101/53  Pulse: 78 61  Resp: 14 14  Temp:  36.6 C  SpO2: 100% 100%    Last Pain:  Vitals:   02/28/19 0701  TempSrc: Oral                 Naheim Burgen A.

## 2019-03-02 ENCOUNTER — Encounter (HOSPITAL_BASED_OUTPATIENT_CLINIC_OR_DEPARTMENT_OTHER): Payer: Self-pay | Admitting: General Surgery

## 2019-07-04 ENCOUNTER — Ambulatory Visit: Payer: Medicare HMO | Admitting: Podiatry

## 2019-10-03 ENCOUNTER — Ambulatory Visit: Payer: Medicare HMO | Admitting: Podiatry

## 2019-10-30 ENCOUNTER — Ambulatory Visit: Payer: Medicare HMO | Admitting: Podiatry

## 2019-12-22 ENCOUNTER — Ambulatory Visit: Payer: Medicare HMO | Admitting: Podiatry

## 2020-09-12 ENCOUNTER — Other Ambulatory Visit: Payer: Self-pay | Admitting: Internal Medicine

## 2020-09-13 LAB — COMPLETE METABOLIC PANEL WITH GFR
AG Ratio: 1.4 (calc) (ref 1.0–2.5)
ALT: 7 U/L — ABNORMAL LOW (ref 9–46)
AST: 12 U/L (ref 10–35)
Albumin: 4.1 g/dL (ref 3.6–5.1)
Alkaline phosphatase (APISO): 120 U/L (ref 35–144)
BUN: 10 mg/dL (ref 7–25)
CO2: 24 mmol/L (ref 20–32)
Calcium: 8.3 mg/dL — ABNORMAL LOW (ref 8.6–10.3)
Chloride: 104 mmol/L (ref 98–110)
Creat: 0.99 mg/dL (ref 0.70–1.25)
GFR, Est African American: 90 mL/min/{1.73_m2} (ref >=60)
GFR, Est Non African American: 77 mL/min/{1.73_m2} (ref >=60)
Globulin: 2.9 g/dL (calc) (ref 1.9–3.7)
Glucose, Bld: 81 mg/dL (ref 65–99)
Potassium: 4.1 mmol/L (ref 3.5–5.3)
Sodium: 139 mmol/L (ref 135–146)
Total Bilirubin: 0.5 mg/dL (ref 0.2–1.2)
Total Protein: 7 g/dL (ref 6.1–8.1)

## 2020-09-13 LAB — LIPID PANEL
Cholesterol: 112 mg/dL (ref <200)
HDL: 32 mg/dL — ABNORMAL LOW (ref >=40)
LDL Cholesterol (Calc): 62 mg/dL (calc)
Non-HDL Cholesterol (Calc): 80 mg/dL (calc) (ref <130)
Total CHOL/HDL Ratio: 3.5 (calc) (ref <5.0)
Triglycerides: 97 mg/dL (ref <150)

## 2020-09-13 LAB — CBC
HCT: 34.7 % — ABNORMAL LOW (ref 38.5–50.0)
Hemoglobin: 12.2 g/dL — ABNORMAL LOW (ref 13.2–17.1)
MCH: 30 pg (ref 27.0–33.0)
MCHC: 35.2 g/dL (ref 32.0–36.0)
MCV: 85.3 fL (ref 80.0–100.0)
MPV: 10.6 fL (ref 7.5–12.5)
Platelets: 278 10*3/uL (ref 140–400)
RBC: 4.07 10*6/uL — ABNORMAL LOW (ref 4.20–5.80)
RDW: 13.3 % (ref 11.0–15.0)
WBC: 6.6 10*3/uL (ref 3.8–10.8)

## 2020-09-13 LAB — TSH: TSH: 0.82 mIU/L (ref 0.40–4.50)

## 2020-09-13 LAB — PSA: PSA: 2.22 ng/mL (ref <=4.0)

## 2020-09-13 LAB — VITAMIN D 25 HYDROXY (VIT D DEFICIENCY, FRACTURES): Vit D, 25-Hydroxy: 38 ng/mL (ref 30–100)

## 2020-10-28 ENCOUNTER — Ambulatory Visit (INDEPENDENT_AMBULATORY_CARE_PROVIDER_SITE_OTHER): Payer: Medicare Other | Admitting: Podiatry

## 2020-10-28 ENCOUNTER — Other Ambulatory Visit: Payer: Self-pay

## 2020-10-28 DIAGNOSIS — L989 Disorder of the skin and subcutaneous tissue, unspecified: Secondary | ICD-10-CM

## 2020-10-28 DIAGNOSIS — M79674 Pain in right toe(s): Secondary | ICD-10-CM | POA: Diagnosis not present

## 2020-10-28 DIAGNOSIS — M79675 Pain in left toe(s): Secondary | ICD-10-CM | POA: Diagnosis not present

## 2020-10-28 DIAGNOSIS — B351 Tinea unguium: Secondary | ICD-10-CM

## 2020-10-28 NOTE — Progress Notes (Signed)
    Subjective: Patient is a 70 y.o. male presenting to the office today with a chief complaint of painful callus lesion(s) noted to the bilateral feet that has been present for the past year or so Patient also complains of elongated, thickened nails that cause pain while ambulating in shoes.  He is unable to trim his own nails. Patient presents today for further treatment and evaluation.  Past Medical History:  Diagnosis Date  . GERD (gastroesophageal reflux disease)   . History of drug overdose    in epic documented 09/ 2015  accidental carbamazepine overdose  . Hypertension   . Inguinal hernia    Left  . Mental retardation   . 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 2 wks from 02-27-2019 , stated pt seed neurologist but unable to give the name    Objective:  Physical Exam General: Alert and oriented x3 in no acute distress  Dermatology: Hyperkeratotic lesion(s) present on the bilateral feet. Pain on palpation with a central nucleated core noted. Skin is warm, dry and supple bilateral lower extremities. Negative for open lesions or macerations. Nails are tender, long, thickened and dystrophic with subungual debris, consistent with onychomycosis, 1-5 bilateral. No signs of infection noted.  Vascular: Palpable pedal pulses bilaterally. No edema or erythema noted. Capillary refill within normal limits.  Neurological: Epicritic and protective threshold grossly intact bilaterally.   Musculoskeletal Exam: Pain on palpation at the keratotic lesion(s) noted. Range of motion within normal limits bilateral. Muscle strength 5/5 in all groups bilateral.  Assessment: 1. Onychodystrophic nails 1-5 bilateral with hyperkeratosis of nails.  2. Onychomycosis of nail due to dermatophyte bilateral 3.  Preulcerative callus lesions to the bilateral feet   Plan of Care:  1. Patient evaluated. 2.  Excisional debridement of keratoic lesion(s) using a chisel blade was performed without incident.  3. Dressed with light dressing. 4. Mechanical debridement of nails 1-5 bilaterally performed using a nail nipper. Filed with dremel without incident.  5. Patient is to return to the clinic in 3 months.   Felecia Shelling, DPM [facility]  Dr. Felecia Shelling, DPM    [address] [place], [place] [address]                Office [phone]  Fax [phone]

## 2021-01-28 ENCOUNTER — Ambulatory Visit (INDEPENDENT_AMBULATORY_CARE_PROVIDER_SITE_OTHER): Payer: Medicare Other | Admitting: Podiatry

## 2021-01-28 ENCOUNTER — Other Ambulatory Visit: Payer: Self-pay

## 2021-01-28 ENCOUNTER — Encounter: Payer: Self-pay | Admitting: Podiatry

## 2021-01-28 DIAGNOSIS — B351 Tinea unguium: Secondary | ICD-10-CM | POA: Diagnosis not present

## 2021-01-28 DIAGNOSIS — M79674 Pain in right toe(s): Secondary | ICD-10-CM

## 2021-01-28 DIAGNOSIS — M79675 Pain in left toe(s): Secondary | ICD-10-CM | POA: Diagnosis not present

## 2021-01-28 NOTE — Progress Notes (Signed)
This patient returns to the office for evaluation and treatment of long thick painful nails .  This patient is unable to trim his own nails since the patient cannot reach his feet.  Patient says the nails are painful walking and wearing his shoes.  He returns for preventive foot care services. He presents to the office with male caregiver. ° °General Appearance  Alert, conversant and in no acute stress. ° °Vascular  Dorsalis pedis and posterior tibial  pulses are palpable  bilaterally.  Capillary return is within normal limits  bilaterally. Temperature is within normal limits  bilaterally. ° °Neurologic  Senn-Weinstein monofilament wire test within normal limits  bilaterally. Muscle power within normal limits bilaterally. ° °Nails Thick disfigured discolored nails with subungual debris  from hallux to fifth toes bilaterally. No evidence of bacterial infection or drainage bilaterally. ° °Orthopedic  No limitations of motion  feet .  No crepitus or effusions noted.  No bony pathology or digital deformities noted. ° °Skin  normotropic skin with no porokeratosis noted bilaterally.  No signs of infections or ulcers noted.    ° °Onychomycosis  Pain in toes right foot  Pain in toes left foot ° °Debridement  of nails  1-5  B/L with a nail nipper.  Nails were then filed using a dremel tool with no incidents.    RTC  3 months  ° ° °Tacie Mccuistion DPM  °

## 2021-05-06 ENCOUNTER — Ambulatory Visit: Payer: Medicare Other | Admitting: Podiatry

## 2021-05-27 ENCOUNTER — Encounter: Payer: Self-pay | Admitting: Podiatry

## 2021-05-27 ENCOUNTER — Ambulatory Visit (INDEPENDENT_AMBULATORY_CARE_PROVIDER_SITE_OTHER): Payer: Medicare Other | Admitting: Podiatry

## 2021-05-27 DIAGNOSIS — M79674 Pain in right toe(s): Secondary | ICD-10-CM

## 2021-05-27 DIAGNOSIS — B351 Tinea unguium: Secondary | ICD-10-CM | POA: Diagnosis not present

## 2021-05-27 DIAGNOSIS — M79675 Pain in left toe(s): Secondary | ICD-10-CM | POA: Diagnosis not present

## 2021-05-27 NOTE — Progress Notes (Signed)
This patient returns to the office for evaluation and treatment of long thick painful nails .  This patient is unable to trim his own nails since the patient cannot reach his feet.  Patient says the nails are painful walking and wearing his shoes.  He returns for preventive foot care services. He presents to the office with male caregiver. ° °General Appearance  Alert, conversant and in no acute stress. ° °Vascular  Dorsalis pedis and posterior tibial  pulses are palpable  bilaterally.  Capillary return is within normal limits  bilaterally. Temperature is within normal limits  bilaterally. ° °Neurologic  Senn-Weinstein monofilament wire test within normal limits  bilaterally. Muscle power within normal limits bilaterally. ° °Nails Thick disfigured discolored nails with subungual debris  from hallux to fifth toes bilaterally. No evidence of bacterial infection or drainage bilaterally. ° °Orthopedic  No limitations of motion  feet .  No crepitus or effusions noted.  No bony pathology or digital deformities noted. ° °Skin  normotropic skin with no porokeratosis noted bilaterally.  No signs of infections or ulcers noted.    ° °Onychomycosis  Pain in toes right foot  Pain in toes left foot ° °Debridement  of nails  1-5  B/L with a nail nipper.  Nails were then filed using a dremel tool with no incidents.    RTC  3 months  ° ° °Rowene Suto DPM  °

## 2021-08-27 ENCOUNTER — Other Ambulatory Visit: Payer: Self-pay

## 2021-08-27 ENCOUNTER — Encounter: Payer: Self-pay | Admitting: Podiatry

## 2021-08-27 ENCOUNTER — Ambulatory Visit (INDEPENDENT_AMBULATORY_CARE_PROVIDER_SITE_OTHER): Payer: Medicare Other | Admitting: Podiatry

## 2021-08-27 DIAGNOSIS — B351 Tinea unguium: Secondary | ICD-10-CM | POA: Diagnosis not present

## 2021-08-27 DIAGNOSIS — M79675 Pain in left toe(s): Secondary | ICD-10-CM

## 2021-08-27 DIAGNOSIS — M79674 Pain in right toe(s): Secondary | ICD-10-CM | POA: Diagnosis not present

## 2021-08-27 DIAGNOSIS — L989 Disorder of the skin and subcutaneous tissue, unspecified: Secondary | ICD-10-CM

## 2021-08-27 NOTE — Progress Notes (Signed)
This patient returns to the office for evaluation and treatment of long thick painful nails .  This patient is unable to trim his own nails since the patient cannot reach his feet.  Patient says the nails are painful walking and wearing his shoes.  He returns for preventive foot care services. He presents to the office with male caregiver.  General Appearance  Alert, conversant and in no acute stress.  Vascular  Dorsalis pedis and posterior tibial  pulses are palpable  bilaterally.  Capillary return is within normal limits  bilaterally. Temperature is within normal limits  bilaterally.  Neurologic  Senn-Weinstein monofilament wire test within normal limits  bilaterally. Muscle power within normal limits bilaterally.  Nails Thick disfigured discolored nails with subungual debris  from hallux to fifth toes bilaterally. No evidence of bacterial infection or drainage bilaterally.  Orthopedic  No limitations of motion  feet .  No crepitus or effusions noted.  No bony pathology or digital deformities noted.  Skin  normotropic skin with no porokeratosis noted bilaterally.  No signs of infections or ulcers noted.     Onychomycosis  Pain in toes right foot  Pain in toes left foot  Debridement  of nails  1-5  B/L with a nail nipper.  Nails were then filed using a dremel tool with no incidents.    RTC  3 months    Helane Gunther DPM

## 2021-11-26 ENCOUNTER — Ambulatory Visit (INDEPENDENT_AMBULATORY_CARE_PROVIDER_SITE_OTHER): Payer: Medicare Other | Admitting: Podiatry

## 2021-11-26 ENCOUNTER — Other Ambulatory Visit: Payer: Self-pay

## 2021-11-26 ENCOUNTER — Encounter: Payer: Self-pay | Admitting: Podiatry

## 2021-11-26 DIAGNOSIS — M79674 Pain in right toe(s): Secondary | ICD-10-CM

## 2021-11-26 DIAGNOSIS — M79675 Pain in left toe(s): Secondary | ICD-10-CM

## 2021-11-26 DIAGNOSIS — B351 Tinea unguium: Secondary | ICD-10-CM

## 2021-11-26 NOTE — Progress Notes (Signed)
This patient returns to the office for evaluation and treatment of long thick painful nails .  This patient is unable to trim his own nails since the patient cannot reach his feet.  Patient says the nails are painful walking and wearing his shoes.  He returns for preventive foot care services. He presents to the office with male caregiver. ° °General Appearance  Alert, conversant and in no acute stress. ° °Vascular  Dorsalis pedis and posterior tibial  pulses are palpable  bilaterally.  Capillary return is within normal limits  bilaterally. Temperature is within normal limits  bilaterally. ° °Neurologic  Senn-Weinstein monofilament wire test within normal limits  bilaterally. Muscle power within normal limits bilaterally. ° °Nails Thick disfigured discolored nails with subungual debris  from hallux to fifth toes bilaterally. No evidence of bacterial infection or drainage bilaterally. ° °Orthopedic  No limitations of motion  feet .  No crepitus or effusions noted.  No bony pathology or digital deformities noted. ° °Skin  normotropic skin with no porokeratosis noted bilaterally.  No signs of infections or ulcers noted.    ° °Onychomycosis  Pain in toes right foot  Pain in toes left foot ° °Debridement  of nails  1-5  B/L with a nail nipper.  Nails were then filed using a dremel tool with no incidents.    RTC  3 months  ° ° °Magdaleno Lortie DPM  °

## 2022-02-18 ENCOUNTER — Encounter: Payer: Self-pay | Admitting: Podiatry

## 2022-02-27 ENCOUNTER — Ambulatory Visit: Payer: Medicare Other | Admitting: Podiatry

## 2022-03-04 ENCOUNTER — Ambulatory Visit: Payer: Medicare Other | Admitting: Podiatry

## 2022-04-07 ENCOUNTER — Encounter: Payer: Self-pay | Admitting: Podiatry

## 2022-04-07 ENCOUNTER — Ambulatory Visit (INDEPENDENT_AMBULATORY_CARE_PROVIDER_SITE_OTHER): Payer: Medicare Other | Admitting: Podiatry

## 2022-04-07 DIAGNOSIS — B351 Tinea unguium: Secondary | ICD-10-CM | POA: Diagnosis not present

## 2022-04-07 DIAGNOSIS — M79675 Pain in left toe(s): Secondary | ICD-10-CM | POA: Diagnosis not present

## 2022-04-07 DIAGNOSIS — M79674 Pain in right toe(s): Secondary | ICD-10-CM | POA: Diagnosis not present

## 2022-04-07 NOTE — Progress Notes (Signed)
This patient returns to the office for evaluation and treatment of long thick painful nails .  This patient is unable to trim his own nails since the patient cannot reach his feet.  Patient says the nails are painful walking and wearing his shoes.  He returns for preventive foot care services. He presents to the office with male caregiver.  General Appearance  Alert, conversant and in no acute stress.  Vascular  Dorsalis pedis and posterior tibial  pulses are palpable  bilaterally.  Capillary return is within normal limits  bilaterally. Temperature is within normal limits  bilaterally.  Neurologic  Senn-Weinstein monofilament wire test within normal limits  bilaterally. Muscle power within normal limits bilaterally.  Nails Thick disfigured discolored nails with subungual debris  from hallux to fifth toes bilaterally. No evidence of bacterial infection or drainage bilaterally.  Orthopedic  No limitations of motion  feet .  No crepitus or effusions noted.  No bony pathology or digital deformities noted.  Skin  normotropic skin with no porokeratosis noted bilaterally.  No signs of infections or ulcers noted.     Onychomycosis  Pain in toes right foot  Pain in toes left foot  Debridement  of nails  1-5  B/L with a nail nipper.  Nails were then filed using a dremel tool with no incidents.    RTC  4  months    Raha Tennison DPM  

## 2022-08-10 ENCOUNTER — Ambulatory Visit: Payer: Medicare Other | Admitting: Podiatry

## 2022-09-07 ENCOUNTER — Ambulatory Visit (INDEPENDENT_AMBULATORY_CARE_PROVIDER_SITE_OTHER): Payer: 59 | Admitting: Podiatry

## 2022-09-07 ENCOUNTER — Encounter: Payer: Self-pay | Admitting: Podiatry

## 2022-09-07 DIAGNOSIS — M79675 Pain in left toe(s): Secondary | ICD-10-CM | POA: Diagnosis not present

## 2022-09-07 DIAGNOSIS — M79674 Pain in right toe(s): Secondary | ICD-10-CM

## 2022-09-07 DIAGNOSIS — B351 Tinea unguium: Secondary | ICD-10-CM | POA: Diagnosis not present

## 2022-09-07 NOTE — Progress Notes (Signed)
This patient returns to the office for evaluation and treatment of long thick painful nails .  This patient is unable to trim his own nails since the patient cannot reach his feet.  Patient says the nails are painful walking and wearing his shoes.  He returns for preventive foot care services. He presents to the office with male caregiver.  General Appearance  Alert, conversant and in no acute stress.  Vascular  Dorsalis pedis and posterior tibial  pulses are palpable  bilaterally.  Capillary return is within normal limits  bilaterally. Temperature is within normal limits  bilaterally.  Neurologic  Senn-Weinstein monofilament wire test within normal limits  bilaterally. Muscle power within normal limits bilaterally.  Nails Thick disfigured discolored nails with subungual debris  from hallux to fifth toes bilaterally. No evidence of bacterial infection or drainage bilaterally.  Orthopedic  No limitations of motion  feet .  No crepitus or effusions noted.  No bony pathology or digital deformities noted.  Skin  normotropic skin with no porokeratosis noted bilaterally.  No signs of infections or ulcers noted.     Onychomycosis  Pain in toes right foot  Pain in toes left foot  Debridement  of nails  1-5  B/L with a nail nipper.  Nails were then filed using a dremel tool with no incidents.    RTC  4  months    Gardiner Barefoot DPM

## 2023-01-04 ENCOUNTER — Ambulatory Visit (INDEPENDENT_AMBULATORY_CARE_PROVIDER_SITE_OTHER): Payer: 59 | Admitting: Podiatry

## 2023-01-04 ENCOUNTER — Encounter: Payer: Self-pay | Admitting: Podiatry

## 2023-01-04 DIAGNOSIS — M79675 Pain in left toe(s): Secondary | ICD-10-CM | POA: Diagnosis not present

## 2023-01-04 DIAGNOSIS — M79674 Pain in right toe(s): Secondary | ICD-10-CM | POA: Diagnosis not present

## 2023-01-04 DIAGNOSIS — B351 Tinea unguium: Secondary | ICD-10-CM

## 2023-01-04 NOTE — Progress Notes (Signed)
This patient returns to the office for evaluation and treatment of long thick painful nails .  This patient is unable to trim his own nails since the patient cannot reach his feet.  Patient says the nails are painful walking and wearing his shoes.  He returns for preventive foot care services. He presents to the office with male caregiver.  General Appearance  Alert, conversant and in no acute stress.  Vascular  Dorsalis pedis and posterior tibial  pulses are palpable  bilaterally.  Capillary return is within normal limits  bilaterally. Temperature is within normal limits  bilaterally.  Neurologic  Senn-Weinstein monofilament wire test within normal limits  bilaterally. Muscle power within normal limits bilaterally.  Nails Thick disfigured discolored nails with subungual debris  from hallux to fifth toes bilaterally. No evidence of bacterial infection or drainage bilaterally.  Orthopedic  No limitations of motion  feet .  No crepitus or effusions noted.  No bony pathology or digital deformities noted.  Skin  normotropic skin with no porokeratosis noted bilaterally.  No signs of infections or ulcers noted.     Onychomycosis  Pain in toes right foot  Pain in toes left foot  Debridement  of nails  1-5  B/L with a nail nipper.  Nails were then filed using a dremel tool with no incidents.    RTC  4  months    Danille Oppedisano DPM  

## 2023-04-06 ENCOUNTER — Ambulatory Visit: Payer: 59 | Admitting: Podiatry

## 2023-04-09 ENCOUNTER — Ambulatory Visit: Payer: 59 | Admitting: Podiatry

## 2023-04-09 ENCOUNTER — Encounter: Payer: Self-pay | Admitting: Podiatry

## 2023-04-09 DIAGNOSIS — M79675 Pain in left toe(s): Secondary | ICD-10-CM

## 2023-04-09 DIAGNOSIS — B351 Tinea unguium: Secondary | ICD-10-CM | POA: Diagnosis not present

## 2023-04-09 DIAGNOSIS — M79674 Pain in right toe(s): Secondary | ICD-10-CM | POA: Diagnosis not present

## 2023-04-09 NOTE — Progress Notes (Addendum)
This patient returns to the office for evaluation and treatment of long thick painful nails .  This patient is unable to trim his own nails since the patient cannot reach his feet.  Patient says the nails are painful walking and wearing his shoes.  He returns for preventive foot care services. He presents to the office with male caregiver.  General Appearance  Alert, conversant and in no acute stress.  Vascular  Dorsalis pedis and posterior tibial  pulses are palpable  bilaterally.  Capillary return is within normal limits  bilaterally. Temperature is within normal limits  bilaterally.  Neurologic  Senn-Weinstein monofilament wire test within normal limits  bilaterally. Muscle power within normal limits bilaterally.  Nails Thick disfigured discolored nails with subungual debris  from hallux to fifth toes bilaterally. No evidence of bacterial infection or drainage bilaterally.  Orthopedic  No limitations of motion  feet .  No crepitus or effusions noted.  No bony pathology or digital deformities noted.  Skin  normotropic skin with no porokeratosis noted bilaterally.  No signs of infections or ulcers noted.     Onychomycosis  Pain in toes right foot  Pain in toes left foot  Debridement  of nails  1-5  B/L with a nail nipper.  Nails were then filed using a dremel tool with no incidents.    RTC  3 months    Helane Gunther DPM

## 2023-08-10 ENCOUNTER — Ambulatory Visit: Payer: 59 | Admitting: Podiatry

## 2023-08-10 ENCOUNTER — Ambulatory Visit (INDEPENDENT_AMBULATORY_CARE_PROVIDER_SITE_OTHER): Payer: Medicare Other | Admitting: Podiatry

## 2023-08-10 DIAGNOSIS — M79675 Pain in left toe(s): Secondary | ICD-10-CM

## 2023-08-10 DIAGNOSIS — M79674 Pain in right toe(s): Secondary | ICD-10-CM

## 2023-08-10 DIAGNOSIS — B351 Tinea unguium: Secondary | ICD-10-CM | POA: Diagnosis not present

## 2023-08-12 ENCOUNTER — Encounter: Payer: Self-pay | Admitting: Podiatry

## 2023-08-12 NOTE — Progress Notes (Signed)
This patient returns to the office for evaluation and treatment of long thick painful nails .  This patient is unable to trim his own nails since the patient cannot reach his feet.  Patient says the nails are painful walking and wearing his shoes.  He returns for preventive foot care services. He presents to the office with male caregiver.  General Appearance  Alert, conversant and in no acute stress.  Vascular  Dorsalis pedis and posterior tibial  pulses are palpable  bilaterally.  Capillary return is within normal limits  bilaterally. Temperature is within normal limits  bilaterally.  Neurologic  Senn-Weinstein monofilament wire test within normal limits  bilaterally. Muscle power within normal limits bilaterally.  Nails Thick disfigured discolored nails with subungual debris  from hallux to fifth toes bilaterally. No evidence of bacterial infection or drainage bilaterally.  Orthopedic  No limitations of motion  feet .  No crepitus or effusions noted.  No bony pathology or digital deformities noted.  Skin  normotropic skin with no porokeratosis noted bilaterally.  No signs of infections or ulcers noted.     Onychomycosis  Pain in toes right foot  Pain in toes left foot  Debridement  of nails  1-5  B/L with a nail nipper.  Nails were then filed using a dremel tool with no incidents.    Bed bugs were found falling from his person during treatment.  RTC  3  months    Helane Gunther DPM

## 2023-11-09 ENCOUNTER — Ambulatory Visit: Payer: Medicare Other | Admitting: Podiatry

## 2024-01-13 ENCOUNTER — Ambulatory Visit: Admitting: Podiatry

## 2024-01-14 ENCOUNTER — Ambulatory Visit: Admitting: Podiatry

## 2024-01-14 ENCOUNTER — Encounter: Payer: Self-pay | Admitting: Podiatry

## 2024-01-14 ENCOUNTER — Ambulatory Visit (INDEPENDENT_AMBULATORY_CARE_PROVIDER_SITE_OTHER): Admitting: Podiatry

## 2024-01-14 DIAGNOSIS — B351 Tinea unguium: Secondary | ICD-10-CM | POA: Diagnosis not present

## 2024-01-14 DIAGNOSIS — M79675 Pain in left toe(s): Secondary | ICD-10-CM

## 2024-01-14 DIAGNOSIS — M79674 Pain in right toe(s): Secondary | ICD-10-CM

## 2024-01-14 NOTE — Progress Notes (Signed)
This patient returns to the office for evaluation and treatment of long thick painful nails .  This patient is unable to trim his own nails since the patient cannot reach his feet.  Patient says the nails are painful walking and wearing his shoes.  He returns for preventive foot care services. He presents to the office with male caregiver.  General Appearance  Alert, conversant and in no acute stress.  Vascular  Dorsalis pedis and posterior tibial  pulses are palpable  bilaterally.  Capillary return is within normal limits  bilaterally. Temperature is within normal limits  bilaterally.  Neurologic  Senn-Weinstein monofilament wire test within normal limits  bilaterally. Muscle power within normal limits bilaterally.  Nails Thick disfigured discolored nails with subungual debris  from hallux to fifth toes bilaterally. No evidence of bacterial infection or drainage bilaterally.  Orthopedic  No limitations of motion  feet .  No crepitus or effusions noted.  No bony pathology or digital deformities noted.  Skin  normotropic skin with no porokeratosis noted bilaterally.  No signs of infections or ulcers noted.     Onychomycosis  Pain in toes right foot  Pain in toes left foot  Debridement  of nails  1-5  B/L with a nail nipper.  Nails were then filed using a dremel tool with no incidents.    RTC  3 months    Helane Gunther DPM

## 2024-04-17 ENCOUNTER — Ambulatory Visit: Admitting: Podiatry

## 2024-04-18 ENCOUNTER — Ambulatory Visit: Admitting: Podiatry

## 2024-04-25 ENCOUNTER — Encounter: Payer: Self-pay | Admitting: Podiatry

## 2024-04-25 ENCOUNTER — Ambulatory Visit (INDEPENDENT_AMBULATORY_CARE_PROVIDER_SITE_OTHER): Admitting: Podiatry

## 2024-04-25 DIAGNOSIS — M79674 Pain in right toe(s): Secondary | ICD-10-CM | POA: Diagnosis not present

## 2024-04-25 DIAGNOSIS — B351 Tinea unguium: Secondary | ICD-10-CM | POA: Diagnosis not present

## 2024-04-25 DIAGNOSIS — M79675 Pain in left toe(s): Secondary | ICD-10-CM | POA: Diagnosis not present

## 2024-04-25 NOTE — Progress Notes (Signed)
This patient returns to the office for evaluation and treatment of long thick painful nails .  This patient is unable to trim his own nails since the patient cannot reach his feet.  Patient says the nails are painful walking and wearing his shoes.  He returns for preventive foot care services. He presents to the office with male caregiver.  General Appearance  Alert, conversant and in no acute stress.  Vascular  Dorsalis pedis and posterior tibial  pulses are palpable  bilaterally.  Capillary return is within normal limits  bilaterally. Temperature is within normal limits  bilaterally.  Neurologic  Senn-Weinstein monofilament wire test within normal limits  bilaterally. Muscle power within normal limits bilaterally.  Nails Thick disfigured discolored nails with subungual debris  from hallux to fifth toes bilaterally. No evidence of bacterial infection or drainage bilaterally.  Orthopedic  No limitations of motion  feet .  No crepitus or effusions noted.  No bony pathology or digital deformities noted.  Skin  normotropic skin with no porokeratosis noted bilaterally.  No signs of infections or ulcers noted.     Onychomycosis  Pain in toes right foot  Pain in toes left foot  Debridement  of nails  1-5  B/L with a nail nipper.  Nails were then filed using a dremel tool with no incidents.    RTC  3 months    Helane Gunther DPM

## 2024-07-25 ENCOUNTER — Ambulatory Visit: Admitting: Podiatry

## 2024-08-04 ENCOUNTER — Ambulatory Visit: Admitting: Podiatry

## 2024-08-04 ENCOUNTER — Encounter: Payer: Self-pay | Admitting: Podiatry

## 2024-08-04 DIAGNOSIS — L309 Dermatitis, unspecified: Secondary | ICD-10-CM

## 2024-08-04 DIAGNOSIS — B351 Tinea unguium: Secondary | ICD-10-CM

## 2024-08-04 DIAGNOSIS — M79674 Pain in right toe(s): Secondary | ICD-10-CM

## 2024-08-04 DIAGNOSIS — M79675 Pain in left toe(s): Secondary | ICD-10-CM

## 2024-08-04 NOTE — Progress Notes (Signed)
This patient returns to the office for evaluation and treatment of long thick painful nails .  This patient is unable to trim his own nails since the patient cannot reach his feet.  Patient says the nails are painful walking and wearing his shoes.  He returns for preventive foot care services. He presents to the office with male caregiver.  General Appearance  Alert, conversant and in no acute stress.  Vascular  Dorsalis pedis and posterior tibial  pulses are palpable  bilaterally.  Capillary return is within normal limits  bilaterally. Temperature is within normal limits  bilaterally.  Neurologic  Senn-Weinstein monofilament wire test within normal limits  bilaterally. Muscle power within normal limits bilaterally.  Nails Thick disfigured discolored nails with subungual debris  from hallux to fifth toes bilaterally. No evidence of bacterial infection or drainage bilaterally.  Orthopedic  No limitations of motion  feet .  No crepitus or effusions noted.  No bony pathology or digital deformities noted.  Skin  normotropic skin with no porokeratosis noted bilaterally.  No signs of infections or ulcers noted.     Onychomycosis  Pain in toes right foot  Pain in toes left foot  Debridement  of nails  1-5  B/L with a nail nipper.  Nails were then filed using a dremel tool with no incidents.    RTC  3 months    Helane Gunther DPM

## 2024-11-01 ENCOUNTER — Ambulatory Visit: Admitting: Podiatry
